# Patient Record
Sex: Female | Born: 1935 | Race: White | Hispanic: No | Smoking: Never smoker
Health system: Southern US, Community
[De-identification: ages and names within clinical notes are randomized; demographics above are authoritative.]

## PROBLEM LIST (undated history)

## (undated) DIAGNOSIS — I10 Essential (primary) hypertension: Secondary | ICD-10-CM

## (undated) DIAGNOSIS — I499 Cardiac arrhythmia, unspecified: Secondary | ICD-10-CM

## (undated) DIAGNOSIS — E039 Hypothyroidism, unspecified: Secondary | ICD-10-CM

---

## 2002-01-03 ENCOUNTER — Other Ambulatory Visit: Admission: RE | Admit: 2002-01-03 | Discharge: 2002-01-03 | Payer: Self-pay | Admitting: Dermatology

## 2002-03-17 ENCOUNTER — Other Ambulatory Visit: Admission: RE | Admit: 2002-03-17 | Discharge: 2002-03-17 | Payer: Self-pay | Admitting: Dermatology

## 2003-05-29 ENCOUNTER — Encounter: Payer: Self-pay | Admitting: *Deleted

## 2003-05-29 ENCOUNTER — Emergency Department (HOSPITAL_COMMUNITY): Admission: EM | Admit: 2003-05-29 | Discharge: 2003-05-29 | Payer: Self-pay | Admitting: *Deleted

## 2012-05-21 ENCOUNTER — Emergency Department (HOSPITAL_COMMUNITY): Payer: Medicare Other

## 2012-05-21 ENCOUNTER — Emergency Department (HOSPITAL_COMMUNITY)
Admission: EM | Admit: 2012-05-21 | Discharge: 2012-05-21 | Disposition: A | Discharge status: Home / Self Care | Payer: Medicare Other | Attending: Emergency Medicine | Admitting: Emergency Medicine

## 2012-05-21 ENCOUNTER — Encounter (HOSPITAL_COMMUNITY): Payer: Self-pay

## 2012-05-21 DIAGNOSIS — W010XXA Fall on same level from slipping, tripping and stumbling without subsequent striking against object, initial encounter: Secondary | ICD-10-CM | POA: Insufficient documentation

## 2012-05-21 DIAGNOSIS — IMO0002 Reserved for concepts with insufficient information to code with codable children: Secondary | ICD-10-CM | POA: Insufficient documentation

## 2012-05-21 DIAGNOSIS — M5030 Other cervical disc degeneration, unspecified cervical region: Secondary | ICD-10-CM | POA: Insufficient documentation

## 2012-05-21 DIAGNOSIS — S0990XA Unspecified injury of head, initial encounter: Secondary | ICD-10-CM | POA: Insufficient documentation

## 2012-05-21 DIAGNOSIS — W19XXXA Unspecified fall, initial encounter: Secondary | ICD-10-CM

## 2012-05-21 DIAGNOSIS — S7000XA Contusion of unspecified hip, initial encounter: Secondary | ICD-10-CM

## 2012-05-21 DIAGNOSIS — I10 Essential (primary) hypertension: Secondary | ICD-10-CM | POA: Insufficient documentation

## 2012-05-21 HISTORY — DX: Essential (primary) hypertension: I10

## 2012-05-21 HISTORY — DX: Cardiac arrhythmia, unspecified: I49.9

## 2012-05-21 NOTE — ED Notes (Signed)
Pt reports slipping on wet bathroom floor while at home this evening, pt states her family member mopped earlier this evening. Pt denies LOC - pt noted to have bruising to left face, small abrasion to left clavicle, multiple small bruises to left arm w/ small abrasion to left elbow and contusion to left knee. Pt's only c/o at present is to left elbow w/ movement. Pt A&Ox4 in no acute distress.

## 2012-05-21 NOTE — ED Notes (Signed)
Pt fell in the bathroom, states she hit her left arm on the sink, denies dizzyness or other complaint prior to falling.  Pt is awake, alert, denies neck or head pain.  Per ems pt did fall in some liquid chlorox but has not complaints d/t same.

## 2012-05-21 NOTE — ED Notes (Signed)
D/c instructions reviewed w/ pt and family - pt and family deny any further questions or concerns at present.\ 

## 2012-05-21 NOTE — ED Provider Notes (Signed)
History   This chart was scribed for Benny Lennert, MD by Melba Coon. The patient was seen in room APA06/APA06 and the patient's care was started at 8:42PM.    CSN: [id number]  Arrival date & time 05/21/12  2022   First MD Initiated Contact with Patient 05/21/12 2029      Chief Complaint  Patient presents with  . Fall    (Consider location/radiation/quality/duration/timing/severity/associated sxs/prior treatment) Patient is a 78 y.o. female presenting with fall. The history is provided by the patient and a relative (family). No language interpreter was used.  Fall The accident occurred less than 1 hour ago. The fall occurred while walking (slipped in liquid Chlorox). She fell from a height of 1 to 2 ft. She landed on a hard floor. The volume of blood lost was minimal. The point of impact was the left elbow and left hip. Pain location: none. The pain is at a severity of 1/10. The pain is mild. She was ambulatory at the scene. There was no entrapment after the fall. There was no drug use involved in the accident. There was no alcohol use involved in the accident. Pertinent negatives include no fever, no numbness, no abdominal pain, no vomiting, no headaches and no loss of consciousness. The symptoms are aggravated by activity. Prehospitalization: nothign. She has tried nothing for the symptoms. The treatment provided no relief.  Pt slipped and fell in a puddle of liquid Clorox in the bathroom, no head contact or LOC; pt states she hit her left arm on the sink and fell on her left hip. Pt usually uses her cane; could not get up by herself after fall, but pt still ambulatory compared to baseline. Pt reports no pain. EMS was called out to take pt to the ED. Pt is awake and alert. Family states that abrasions to the head are new; state hat pt does not want to be here. Pt thinks that XRs are unnecessary and that she should go home. Tetanus shot is not up-to-date. Allergic to Prednisone. No other  pertinent medical symptoms.  Past Medical History  Diagnosis Date  . Hypertension   . Irregular heartbeat     History reviewed. No pertinent past surgical history.  No family history on file.  History  Substance Use Topics  . Smoking status: Never Smoker   . Smokeless tobacco: Not on file  . Alcohol Use: No    OB History    Grav Para Term Preterm Abortions TAB SAB Ect Mult Living                  Review of Systems  Constitutional: Negative for fever.       10 Systems reviewed and are negative for acute change except as noted in the HPI.  HENT: Negative for congestion and neck pain.   Eyes: Negative for discharge and redness.  Respiratory: Negative for cough and shortness of breath.   Cardiovascular: Negative for chest pain.  Gastrointestinal: Negative for vomiting and abdominal pain.  Musculoskeletal: Negative for back pain.  Skin: Negative for rash.  Neurological: Negative for dizziness, loss of consciousness, syncope, numbness and headaches.  Psychiatric/Behavioral:       No behavior change.    Allergies  Prednisone  Home Medications  No current outpatient prescriptions on file.  There were no vitals taken for this visit.  Physical Exam  Nursing note and vitals reviewed. Constitutional: She is oriented to person, place, and time. She appears well-developed and well-nourished. She appears  cachectic. No distress.  HENT:  Head: Normocephalic and atraumatic.  Right Ear: External ear normal.  Left Ear: External ear normal.  Eyes: EOM are normal.  Neck: Normal range of motion. No tracheal deviation present.  Cardiovascular: Normal rate, regular rhythm and normal heart sounds.   No murmur heard. Pulmonary/Chest: Effort normal and breath sounds normal. No respiratory distress.  Abdominal: Soft. There is no tenderness.  Musculoskeletal: Normal range of motion. She exhibits no edema and no tenderness.  Neurological: She is alert and oriented to person, place, and  time.  Skin: Skin is warm and dry. Abrasion (Abrasions to left side of face and left forearm) noted. No rash noted.  Psychiatric: She has a normal mood and affect. Her behavior is normal.    ED Course  Procedures (including critical care time)  DIAGNOSTIC STUDIES: Oxygen Saturation is 97% on room air, normal by my interpretation.    COORDINATION OF CARE:  8:45PM - head CT, C-spine CT, and left hip XR will be ordered for the pt.   Labs Reviewed - No data to display No results found.   No diagnosis found.    MDM   The chart was scribed for me under my direct supervision.  I personally performed the history, physical, and medical decision making and all procedures in the evaluation of this patient.Benny Lennert, MD 05/21/12 2150

## 2015-08-14 ENCOUNTER — Other Ambulatory Visit: Payer: Self-pay

## 2015-08-14 ENCOUNTER — Emergency Department (HOSPITAL_COMMUNITY)
Admission: EM | Admit: 2015-08-14 | Discharge: 2015-08-14 | Disposition: A | Discharge status: Home / Self Care | Payer: Medicare Other | Source: Home / Self Care | Attending: Emergency Medicine | Admitting: Emergency Medicine

## 2015-08-14 ENCOUNTER — Encounter (HOSPITAL_COMMUNITY): Payer: Self-pay | Admitting: Emergency Medicine

## 2015-08-14 ENCOUNTER — Emergency Department (HOSPITAL_COMMUNITY): Payer: Medicare Other

## 2015-08-14 DIAGNOSIS — I63511 Cerebral infarction due to unspecified occlusion or stenosis of right middle cerebral artery: Secondary | ICD-10-CM | POA: Diagnosis not present

## 2015-08-14 DIAGNOSIS — R531 Weakness: Secondary | ICD-10-CM

## 2015-08-14 DIAGNOSIS — R4182 Altered mental status, unspecified: Secondary | ICD-10-CM | POA: Diagnosis not present

## 2015-08-14 LAB — BRAIN NATRIURETIC PEPTIDE: B Natriuretic Peptide: 986 pg/mL — ABNORMAL HIGH (ref 0.0–100.0)

## 2015-08-14 LAB — CBC WITH DIFFERENTIAL/PLATELET
BASOS ABS: 0 10*3/uL (ref 0.0–0.1)
Basophils Relative: 0 %
EOS ABS: 0 10*3/uL (ref 0.0–0.7)
EOS PCT: 0 %
HCT: 33 % — ABNORMAL LOW (ref 36.0–46.0)
Hemoglobin: 10.6 g/dL — ABNORMAL LOW (ref 12.0–15.0)
LYMPHS ABS: 2.4 10*3/uL (ref 0.7–4.0)
Lymphocytes Relative: 16 %
MCH: 32.6 pg (ref 26.0–34.0)
MCHC: 32.1 g/dL (ref 30.0–36.0)
MCV: 101.5 fL — AB (ref 78.0–100.0)
MONO ABS: 0.9 10*3/uL (ref 0.1–1.0)
Monocytes Relative: 6 %
Neutro Abs: 12.2 10*3/uL — ABNORMAL HIGH (ref 1.7–7.7)
Neutrophils Relative %: 78 %
PLATELETS: 268 10*3/uL (ref 150–400)
RBC: 3.25 MIL/uL — AB (ref 3.87–5.11)
RDW: 13.7 % (ref 11.5–15.5)
WBC: 15.5 10*3/uL — AB (ref 4.0–10.5)

## 2015-08-14 LAB — URINALYSIS, ROUTINE W REFLEX MICROSCOPIC
BILIRUBIN URINE: NEGATIVE
Glucose, UA: NEGATIVE mg/dL
HGB URINE DIPSTICK: NEGATIVE
KETONES UR: NEGATIVE mg/dL
Leukocytes, UA: NEGATIVE
NITRITE: NEGATIVE
PH: 7 (ref 5.0–8.0)
Protein, ur: 100 mg/dL — AB
SPECIFIC GRAVITY, URINE: 1.02 (ref 1.005–1.030)
Urobilinogen, UA: 0.2 mg/dL (ref 0.0–1.0)

## 2015-08-14 LAB — COMPREHENSIVE METABOLIC PANEL
ALK PHOS: 104 U/L (ref 38–126)
ALT: 19 U/L (ref 14–54)
ANION GAP: 10 (ref 5–15)
AST: 23 U/L (ref 15–41)
Albumin: 3.2 g/dL — ABNORMAL LOW (ref 3.5–5.0)
BILIRUBIN TOTAL: 1.1 mg/dL (ref 0.3–1.2)
BUN: 24 mg/dL — ABNORMAL HIGH (ref 6–20)
CALCIUM: 8.8 mg/dL — AB (ref 8.9–10.3)
CO2: 23 mmol/L (ref 22–32)
CREATININE: 1.05 mg/dL — AB (ref 0.44–1.00)
Chloride: 103 mmol/L (ref 101–111)
GFR, EST AFRICAN AMERICAN: 50 mL/min — AB (ref >60)
GFR, EST NON AFRICAN AMERICAN: 43 mL/min — AB (ref >60)
Glucose, Bld: 145 mg/dL — ABNORMAL HIGH (ref 65–99)
Potassium: 3.8 mmol/L (ref 3.5–5.1)
Sodium: 136 mmol/L (ref 135–145)
TOTAL PROTEIN: 6.6 g/dL (ref 6.5–8.1)

## 2015-08-14 LAB — URINE MICROSCOPIC-ADD ON

## 2015-08-14 LAB — I-STAT CG4 LACTIC ACID, ED: LACTIC ACID, VENOUS: 1.64 mmol/L (ref 0.5–2.0)

## 2015-08-14 LAB — TROPONIN I

## 2015-08-14 MED ORDER — SODIUM CHLORIDE 0.9 % IV SOLN
INTRAVENOUS | Status: DC
  Administered 2015-08-14: 17:00:00 via INTRAVENOUS

## 2015-08-14 NOTE — Discharge Instructions (Signed)
As discussed, with tonight's evaluation, demonstrating dehydration, elevated BNP, reflecting poor efficiency of the heart, it is important she follows up with her primary care physician.  In addition, home health services, social work, we'll contact you regarding possible hospice involvement.   Return here for concerning changes in her condition.   Weakness Weakness is a lack of strength. It may be felt all over the body (generalized) or in one specific part of the body (focal). Some causes of weakness can be serious. You may need further medical evaluation, especially if you are elderly or you have a history of immunosuppression (such as chemotherapy or HIV), kidney disease, heart disease, or diabetes. CAUSES  Weakness can be caused by many different things, including:  Infection.  Physical exhaustion.  Internal bleeding or other blood loss that results in a lack of red blood cells (anemia).  Dehydration. This cause is more common in elderly people.  Side effects or electrolyte abnormalities from medicines, such as pain medicines or sedatives.  Emotional distress, anxiety, or depression.  Circulation problems, especially severe peripheral arterial disease.  Heart disease, such as rapid atrial fibrillation, bradycardia, or heart failure.  Nervous system disorders, such as Guillain-Barr syndrome, multiple sclerosis, or stroke. DIAGNOSIS  To find the cause of your weakness, your caregiver will take your history and perform a physical exam. Lab tests or X-rays may also be ordered, if needed. TREATMENT  Treatment of weakness depends on the cause of your symptoms and can vary greatly. HOME CARE INSTRUCTIONS   Rest as needed.  Eat a well-balanced diet.  Try to get some exercise every day.  Only take over-the-counter or prescription medicines as directed by your caregiver. SEEK MEDICAL CARE IF:   Your weakness seems to be getting worse or spreads to other parts of your  body.  You develop new aches or pains. SEEK IMMEDIATE MEDICAL CARE IF:   You cannot perform your normal daily activities, such as getting dressed and feeding yourself.  You cannot walk up and down stairs, or you feel exhausted when you do so.  You have shortness of breath or chest pain.  You have difficulty moving parts of your body.  You have weakness in only one area of the body or on only one side of the body.  You have a fever.  You have trouble speaking or swallowing.  You cannot control your bladder or bowel movements.  You have black or bloody vomit or stools. MAKE SURE YOU:  Understand these instructions.  Will watch your condition.  Will get help right away if you are not doing well or get worse.   This information is not intended to replace advice given to you by your health care provider. Make sure you discuss any questions you have with your health care provider.   Document Released: 10/06/2005 Document Revised: 04/06/2012 Document Reviewed: 12/05/2011 Elsevier Interactive Patient Education [employer]2016 [employer]

## 2015-08-14 NOTE — ED Notes (Signed)
Per EMS; called to patient's house by family due to generalized weakness and abdominal pain. Family states history of dementia. Patient follows commands [facility] 14. Patient's family stats patient has been complaining of head aches for past week.

## 2015-08-14 NOTE — ED Provider Notes (Signed)
CSN: [id number]     Arrival date & time 08/14/15  1514 History   First MD Initiated Contact with Patient 08/14/15 1521     Chief Complaint  Patient presents with  . Weakness    HPI  Patient presents with multiple family members who provide history of present illness. Patient has dementia. Level V caveat. The family members note that over the past day the patient has become unwilling to walk, left participatory with less oral intake. No report of new fever, chills, vomiting, diarrhea, fall. No new medication, diet, activity.   Past Medical History  Diagnosis Date  . Hypertension   . Irregular heartbeat    History reviewed. No pertinent past surgical history. No family history on file. Social History  Substance Use Topics  . Smoking status: Never Smoker   . Smokeless tobacco: None  . Alcohol Use: No   OB History    No data available     Review of Systems  Unable to perform ROS: Dementia      Allergies  Prednisone  Home Medications   Prior to Admission medications   Medication Sig Start Date End Date Taking? Authorizing Provider  aspirin EC 81 MG tablet Take 81 mg by mouth every morning.   Yes Historical Provider, MD  BYSTOLIC 5 MG tablet Take 5 mg by mouth daily. 07/17/15  Yes Historical Provider, MD  cloNIDine (CATAPRES) 0.1 MG tablet Take 0.1 mg by mouth at bedtime.  07/17/15  Yes Historical Provider, MD  Iron-Vitamins (GERITOL COMPLETE PO) Take 1 tablet by mouth daily.   Yes Historical Provider, MD  levothyroxine (SYNTHROID, LEVOTHROID) 50 MCG tablet Take 50 mcg by mouth daily. 08/05/15  Yes Historical Provider, MD  LORazepam (ATIVAN) 0.5 MG tablet Take 0.25 mg by mouth at bedtime.    Yes Historical Provider, MD  methyldopa (ALDOMET) 250 MG tablet Take 250 mg by mouth 2 (two) times daily. 07/26/15  Yes Historical Provider, MD  Naphazoline HCl (CLEAR EYES OP) Apply 1 drop to eye daily as needed (for dry eye relief).    Yes Historical Provider, MD  polyethylene  glycol powder (GLYCOLAX/MIRALAX) powder Take 17 g by mouth at bedtime.   Yes Historical Provider, MD  senna-docusate (SENNA PLUS) 8.6-50 MG per tablet Take 1 tablet by mouth at bedtime.    Yes Historical Provider, MD  telmisartan (MICARDIS) 40 MG tablet Take 40 mg by mouth every morning.   Yes Historical Provider, MD  verapamil (CALAN-SR) 240 MG CR tablet Take 240 mg by mouth every morning.   Yes Historical Provider, MD   BP 142/44 mmHg  Pulse 62  Temp(Src) 97.4 F (36.3 C) (Oral)  Resp 16  Ht  (1.575 m)  Wt 97 lb (43.999 kg)  BMI 17.74 kg/m2  SpO2 96% Physical Exam  Constitutional: She is oriented to person, place, and time. She appears cachectic. She has a sickly appearance. No distress.  HENT:  Head: Normocephalic and atraumatic.  Eyes: Conjunctivae and EOM are normal.  Cardiovascular: Normal rate and regular rhythm.   Pulmonary/Chest: Effort normal and breath sounds normal. No stridor. No respiratory distress.  Abdominal: She exhibits no distension.  Musculoskeletal: She exhibits no edema.  Neurological: She is alert and oriented to person, place, and time. She displays atrophy. She displays no tremor. No cranial nerve deficit or sensory deficit. She exhibits normal muscle tone. She displays no seizure activity. Coordination normal.  Speech is brief, clear the patient moves all extremity spontaneously.  Skin: Skin is warm  and dry. There is pallor.  Psychiatric: She is slowed and withdrawn. Cognition and memory are impaired.  Nursing note and vitals reviewed.   ED Course  Procedures (including critical care time) Labs Review Labs Reviewed  COMPREHENSIVE METABOLIC PANEL - Abnormal; Notable for the following:    Glucose, Bld 145 (*)    BUN 24 (*)    Creatinine, Ser 1.05 (*)    Calcium 8.8 (*)    Albumin 3.2 (*)    GFR calc non Af Amer 43 (*)    GFR calc Af Amer 50 (*)    All other components within normal limits  BRAIN NATRIURETIC PEPTIDE - Abnormal; Notable for the  following:    B Natriuretic Peptide 986.0 (*)    All other components within normal limits  CBC WITH DIFFERENTIAL/PLATELET - Abnormal; Notable for the following:    WBC 15.5 (*)    RBC 3.25 (*)    Hemoglobin 10.6 (*)    HCT 33.0 (*)    MCV 101.5 (*)    Neutro Abs 12.2 (*)    All other components within normal limits  URINALYSIS, ROUTINE W REFLEX MICROSCOPIC (NOT AT [facility]) - Abnormal; Notable for the following:    Protein, ur 100 (*)    All other components within normal limits  URINE MICROSCOPIC-ADD ON - Abnormal; Notable for the following:    Squamous Epithelial / LPF FEW (*)    Bacteria, UA FEW (*)    Casts HYALINE CASTS (*)    All other components within normal limits  TROPONIN I  I-STAT CG4 LACTIC ACID, ED    Imaging Review Dg Chest Port 1 View  08/14/2015  CLINICAL DATA:  Generalized weakness and abdominal pain, history of dementia. Headaches for past week. EXAM: PORTABLE CHEST 1 VIEW COMPARISON:  None. FINDINGS: There is mild cardiomegaly. Atherosclerotic changes noted along the walls of the grossly normal- caliber aortic arch. Pulmonary vasculature is within normal limits. Lungs appear hyperexpanded suggesting COPD. Suspect mild chronic scarring/fibrosis within each lung, upper lobe predominant. No confluent airspace opacity to suggest a developing pneumonia. No pleural effusions seen. No pneumothorax. Osseous structures are unremarkable. IMPRESSION: 1. Hyperexpanded lungs suggesting COPD. 2. Mild cardiomegaly. 3. No evidence of acute cardiopulmonary abnormality. Electronically Signed   By: Bary RichardStan  Maynard M.D.   On: 08/14/2015 16:42   I have personally reviewed and evaluated these images and lab results as part of my medical decision-making.   EKG with sinus rhythm, rate 61, artifact, intraventricular conduction delay, abnormal  On repeat exam, IV fluids running, the patient appears slightly better, is now opening her eyes.   9:36 PM Patient now awake, interactive.  There  are additional family members present, and with them we discussed all findings, including elevated BNP, mild dehydration. Given the patient's age, comorbidities, findings, advocated for home health evaluation, consideration of hospice enrollment at some point. Family members were agreeable with these suggestions. MDM   Final diagnoses:  Weakness   This elderly female presents with family members  who assist with the history of present illness given the patient's history of dementia. Here the patient is listless, but hemodynamically stable. Labs notable for mildly elevated BNP, leukocytosis, evidence of dehydration. With fluid resuscitation, the patient awakens, is interacting at baseline. No evidence for decompensated heart failure, nor infection, with reassuring urine, x-ray, no cutaneous findings. I had lengthy conversations with multiple family members about the patient's health, prognosis, need for home assistance, particularly given her diminished ambulatory capacity.  Patient discharged in  stable condition with home health evaluation, consideration of hospice enrollment.    Gerhard Munch, MD 08/14/15 2138

## 2015-08-16 ENCOUNTER — Emergency Department (HOSPITAL_COMMUNITY): Payer: Medicare Other

## 2015-08-16 ENCOUNTER — Encounter (HOSPITAL_COMMUNITY): Payer: Self-pay

## 2015-08-16 ENCOUNTER — Inpatient Hospital Stay (HOSPITAL_COMMUNITY)
Admission: EM | Admit: 2015-08-16 | Discharge: 2015-08-21 | DRG: 064 | Disposition: A | Discharge status: Hospice | Payer: Medicare Other | Attending: Family Medicine | Admitting: Family Medicine

## 2015-08-16 DIAGNOSIS — Z515 Encounter for palliative care: Secondary | ICD-10-CM | POA: Diagnosis present

## 2015-08-16 DIAGNOSIS — I6330 Cerebral infarction due to thrombosis of unspecified cerebral artery: Secondary | ICD-10-CM | POA: Insufficient documentation

## 2015-08-16 DIAGNOSIS — E785 Hyperlipidemia, unspecified: Secondary | ICD-10-CM | POA: Diagnosis present

## 2015-08-16 DIAGNOSIS — I639 Cerebral infarction, unspecified: Secondary | ICD-10-CM | POA: Diagnosis present

## 2015-08-16 DIAGNOSIS — J449 Chronic obstructive pulmonary disease, unspecified: Secondary | ICD-10-CM | POA: Diagnosis present

## 2015-08-16 DIAGNOSIS — M810 Age-related osteoporosis without current pathological fracture: Secondary | ICD-10-CM | POA: Diagnosis present

## 2015-08-16 DIAGNOSIS — F0390 Unspecified dementia without behavioral disturbance: Secondary | ICD-10-CM | POA: Diagnosis present

## 2015-08-16 DIAGNOSIS — J438 Other emphysema: Secondary | ICD-10-CM

## 2015-08-16 DIAGNOSIS — Z681 Body mass index (BMI) 19 or less, adult: Secondary | ICD-10-CM

## 2015-08-16 DIAGNOSIS — E43 Unspecified severe protein-calorie malnutrition: Secondary | ICD-10-CM | POA: Diagnosis present

## 2015-08-16 DIAGNOSIS — I69354 Hemiplegia and hemiparesis following cerebral infarction affecting left non-dominant side: Secondary | ICD-10-CM

## 2015-08-16 DIAGNOSIS — N289 Disorder of kidney and ureter, unspecified: Secondary | ICD-10-CM

## 2015-08-16 DIAGNOSIS — I6300 Cerebral infarction due to thrombosis of unspecified precerebral artery: Secondary | ICD-10-CM

## 2015-08-16 DIAGNOSIS — I63511 Cerebral infarction due to unspecified occlusion or stenosis of right middle cerebral artery: Principal | ICD-10-CM | POA: Diagnosis present

## 2015-08-16 DIAGNOSIS — R531 Weakness: Secondary | ICD-10-CM | POA: Diagnosis present

## 2015-08-16 DIAGNOSIS — R410 Disorientation, unspecified: Secondary | ICD-10-CM | POA: Diagnosis not present

## 2015-08-16 DIAGNOSIS — R4182 Altered mental status, unspecified: Secondary | ICD-10-CM | POA: Diagnosis present

## 2015-08-16 DIAGNOSIS — J9611 Chronic respiratory failure with hypoxia: Secondary | ICD-10-CM | POA: Diagnosis present

## 2015-08-16 DIAGNOSIS — Z66 Do not resuscitate: Secondary | ICD-10-CM | POA: Diagnosis present

## 2015-08-16 DIAGNOSIS — M1990 Unspecified osteoarthritis, unspecified site: Secondary | ICD-10-CM | POA: Diagnosis present

## 2015-08-16 DIAGNOSIS — R55 Syncope and collapse: Secondary | ICD-10-CM | POA: Diagnosis present

## 2015-08-16 DIAGNOSIS — E039 Hypothyroidism, unspecified: Secondary | ICD-10-CM | POA: Diagnosis present

## 2015-08-16 DIAGNOSIS — I638 Other cerebral infarction: Secondary | ICD-10-CM | POA: Diagnosis not present

## 2015-08-16 DIAGNOSIS — I10 Essential (primary) hypertension: Secondary | ICD-10-CM | POA: Diagnosis present

## 2015-08-16 DIAGNOSIS — Z7982 Long term (current) use of aspirin: Secondary | ICD-10-CM

## 2015-08-16 DIAGNOSIS — G8194 Hemiplegia, unspecified affecting left nondominant side: Secondary | ICD-10-CM | POA: Diagnosis present

## 2015-08-16 DIAGNOSIS — E860 Dehydration: Secondary | ICD-10-CM | POA: Diagnosis present

## 2015-08-16 DIAGNOSIS — R400 Somnolence: Secondary | ICD-10-CM

## 2015-08-16 DIAGNOSIS — I6389 Other cerebral infarction: Secondary | ICD-10-CM

## 2015-08-16 DIAGNOSIS — D649 Anemia, unspecified: Secondary | ICD-10-CM | POA: Diagnosis present

## 2015-08-16 DIAGNOSIS — I6789 Other cerebrovascular disease: Secondary | ICD-10-CM | POA: Diagnosis not present

## 2015-08-16 HISTORY — DX: Hypothyroidism, unspecified: E03.9

## 2015-08-16 LAB — CBC
HEMATOCRIT: 30.6 % — AB (ref 36.0–46.0)
HEMOGLOBIN: 9.5 g/dL — AB (ref 12.0–15.0)
MCH: 32.6 pg (ref 26.0–34.0)
MCHC: 31 g/dL (ref 30.0–36.0)
MCV: 105.2 fL — AB (ref 78.0–100.0)
Platelets: 221 10*3/uL (ref 150–400)
RBC: 2.91 MIL/uL — ABNORMAL LOW (ref 3.87–5.11)
RDW: 14.1 % (ref 11.5–15.5)
WBC: 10.3 10*3/uL (ref 4.0–10.5)

## 2015-08-16 LAB — URINE MICROSCOPIC-ADD ON

## 2015-08-16 LAB — COMPREHENSIVE METABOLIC PANEL
ALK PHOS: 107 U/L (ref 38–126)
ALT: 17 U/L (ref 14–54)
AST: 29 U/L (ref 15–41)
Albumin: 2.7 g/dL — ABNORMAL LOW (ref 3.5–5.0)
Anion gap: 5 (ref 5–15)
BILIRUBIN TOTAL: 0.4 mg/dL (ref 0.3–1.2)
BUN: 34 mg/dL — AB (ref 6–20)
CALCIUM: 8.5 mg/dL — AB (ref 8.9–10.3)
CO2: 24 mmol/L (ref 22–32)
CREATININE: 1.4 mg/dL — AB (ref 0.44–1.00)
Chloride: 110 mmol/L (ref 101–111)
GFR, EST AFRICAN AMERICAN: 35 mL/min — AB (ref >60)
GFR, EST NON AFRICAN AMERICAN: 30 mL/min — AB (ref >60)
Glucose, Bld: 141 mg/dL — ABNORMAL HIGH (ref 65–99)
Potassium: 4.2 mmol/L (ref 3.5–5.1)
Sodium: 139 mmol/L (ref 135–145)
TOTAL PROTEIN: 6.1 g/dL — AB (ref 6.5–8.1)

## 2015-08-16 LAB — DIFFERENTIAL
BASOS ABS: 0 10*3/uL (ref 0.0–0.1)
BASOS PCT: 0 %
Eosinophils Absolute: 0.1 10*3/uL (ref 0.0–0.7)
Eosinophils Relative: 1 %
LYMPHS ABS: 1.6 10*3/uL (ref 0.7–4.0)
Lymphocytes Relative: 15 %
MONO ABS: 0.8 10*3/uL (ref 0.1–1.0)
MONOS PCT: 8 %
NEUTROS ABS: 7.8 10*3/uL — AB (ref 1.7–7.7)
Neutrophils Relative %: 76 %

## 2015-08-16 LAB — URINALYSIS, ROUTINE W REFLEX MICROSCOPIC
BILIRUBIN URINE: NEGATIVE
Glucose, UA: NEGATIVE mg/dL
Hgb urine dipstick: NEGATIVE
Ketones, ur: NEGATIVE mg/dL
LEUKOCYTES UA: NEGATIVE
NITRITE: NEGATIVE
PH: 6 (ref 5.0–8.0)
Protein, ur: 30 mg/dL — AB
SPECIFIC GRAVITY, URINE: 1.025 (ref 1.005–1.030)
UROBILINOGEN UA: 0.2 mg/dL (ref 0.0–1.0)

## 2015-08-16 LAB — PROTIME-INR
INR: 1.1 (ref 0.00–1.49)
Prothrombin Time: 14.4 seconds (ref 11.6–15.2)

## 2015-08-16 LAB — RAPID URINE DRUG SCREEN, HOSP PERFORMED
Amphetamines: POSITIVE — AB
Barbiturates: NOT DETECTED
Benzodiazepines: POSITIVE — AB
COCAINE: NOT DETECTED
OPIATES: NOT DETECTED
TETRAHYDROCANNABINOL: NOT DETECTED

## 2015-08-16 LAB — LACTIC ACID, PLASMA: Lactic Acid, Venous: 2.6 mmol/L (ref 0.5–2.0)

## 2015-08-16 LAB — CBG MONITORING, ED: GLUCOSE-CAPILLARY: 126 mg/dL — AB (ref 65–99)

## 2015-08-16 LAB — TROPONIN I

## 2015-08-16 LAB — ETHANOL

## 2015-08-16 LAB — APTT: aPTT: 30 seconds (ref 24–37)

## 2015-08-16 LAB — POC OCCULT BLOOD, ED: FECAL OCCULT BLD: POSITIVE — AB

## 2015-08-16 MED ORDER — ASPIRIN 325 MG PO TABS
325.0000 mg | ORAL_TABLET | Freq: Every day | ORAL | Status: DC
  Administered 2015-08-18: 325 mg via ORAL
  Filled 2015-08-16 (×4): qty 1

## 2015-08-16 MED ORDER — STROKE: EARLY STAGES OF RECOVERY BOOK
Freq: Once | Status: AC
  Administered 2015-08-17: 12:00:00
  Filled 2015-08-16: qty 1

## 2015-08-16 MED ORDER — ASPIRIN 300 MG RE SUPP
300.0000 mg | Freq: Every day | RECTAL | Status: DC
  Filled 2015-08-16 (×6): qty 1

## 2015-08-16 NOTE — ED Notes (Signed)
Per Dr. Lynelle DoctorKnapp - Call Code Stroke. Secretary made aware.

## 2015-08-16 NOTE — ED Notes (Signed)
Critical Result called from lab via [facility] Sickle[facility] Lactic Acid 2.6 Read back and verified by Clinical research associatewriter. Dr. Lynelle DoctorKnapp made aware.

## 2015-08-16 NOTE — ED Notes (Signed)
Dr. Lynelle DoctorKnapp made aware of neuro status. No new orders given.

## 2015-08-16 NOTE — ED Notes (Signed)
Caregiver reports patient was sitting on commode attempting to have BM and "passed out." Caregiver reports patient did not fall, caregiver was able to catch patient. Patient appears drowsy. Able to state name/DOB. Denies pain.

## 2015-08-16 NOTE — ED Notes (Signed)
X-ray at bedside

## 2015-08-16 NOTE — ED Notes (Signed)
MD at bedside. 
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
## 2015-08-16 NOTE — ED Notes (Signed)
Returned from Ct at this time with nurse.

## 2015-08-16 NOTE — ED Notes (Signed)
Dr Knapp at bedside,  

## 2015-08-16 NOTE — ED Notes (Signed)
Per Dr. Lynelle DoctorKnapp- Cancel code stroke.

## 2015-08-16 NOTE — ED Notes (Signed)
Pt family requesting to speak with EDP,

## 2015-08-16 NOTE — ED Provider Notes (Signed)
CSN: [id number]   Arrival date & time 08/16/15 2013  History  By signing my name below, I, Brooke Simpson, attest that this documentation has been prepared under the direction and in the presence of Linwood DibblesJon Jonte Shiller, MD. Electronically Signed: [name], ED Scribe. 08/16/2015. 9:21 PM.  Chief Complaint  Patient presents with  . Code Stroke    HPI The history is provided by a caregiver. The history is limited by the condition of the patient. No language interpreter was used.   Level V caveat.   Brooke Simpson is a 81 y.o. female with history of dementia, HTN, and hypothyroidism who presents to the Emergency Department complaining of a syncopal episode 1 hour ago while attempting to have a bowel movement. Per caregiver, the pt went limp, was not talking, and her breathing was "way off". She did not fall and her caregiver was able to assist her from the toilet.  Associated symptoms include left-sided weakness. Pt was treated for dehydration 2 days ago where she had more generalized weakness and was not able to walk but family states that she was able to walk at home over the last 2 days. They state that she seems altered as usually she is able to speak clearly and walk.  No recent fever.   Past Medical History  Diagnosis Date  . Hypertension   . Irregular heartbeat   . Hypothyroid     History reviewed. No pertinent past surgical history.  No family history on file.  Social History  Substance Use Topics  . Smoking status: Never Smoker   . Smokeless tobacco: None  . Alcohol Use: No     Review of Systems  Unable to perform ROS: Dementia    Home Medications   Prior to Admission medications   Medication Sig Start Date End Date Taking? Authorizing Provider  aspirin EC 81 MG tablet Take 81 mg by mouth every morning.    Historical Provider, MD  BYSTOLIC 5 MG tablet Take 5 mg by mouth daily. 07/17/15   Historical Provider, MD  cloNIDine (CATAPRES) 0.1 MG tablet Take 0.1 mg by mouth at  bedtime.  07/17/15   Historical Provider, MD  Iron-Vitamins (GERITOL COMPLETE PO) Take 1 tablet by mouth daily.    Historical Provider, MD  levothyroxine (SYNTHROID, LEVOTHROID) 50 MCG tablet Take 50 mcg by mouth daily. 08/05/15   Historical Provider, MD  LORazepam (ATIVAN) 0.5 MG tablet Take 0.25 mg by mouth at bedtime.     Historical Provider, MD  methyldopa (ALDOMET) 250 MG tablet Take 250 mg by mouth 2 (two) times daily. 07/26/15   Historical Provider, MD  Naphazoline HCl (CLEAR EYES OP) Apply 1 drop to eye daily as needed (for dry eye relief).     Historical Provider, MD  polyethylene glycol powder (GLYCOLAX/MIRALAX) powder Take 17 g by mouth at bedtime.    Historical Provider, MD  senna-docusate (SENNA PLUS) 8.6-50 MG per tablet Take 1 tablet by mouth at bedtime.     Historical Provider, MD  telmisartan (MICARDIS) 40 MG tablet Take 40 mg by mouth every morning.    Historical Provider, MD  verapamil (CALAN-SR) 240 MG CR tablet Take 240 mg by mouth every morning.    Historical Provider, MD    Allergies  Prednisone  Triage Vitals: BP 163/54 mmHg  Pulse 57  Temp(Src) 97.5 F (36.4 C) (Oral)  Resp 14  Ht 5\' 2"  (1.575 m)  Wt 96 lb 2 oz (43.602 kg)  BMI 17.58 kg/m2  SpO2  97%  Physical Exam  Constitutional: She is oriented to person, place, and time. She appears listless. No distress.  Elderly, cachetic, listless  HENT:  Head: Normocephalic and atraumatic.  Right Ear: External ear normal.  Left Ear: External ear normal.  Mouth/Throat: Oropharynx is clear and moist.  Eyes: Conjunctivae are normal. Right eye exhibits no discharge. Left eye exhibits no discharge. No scleral icterus.  Neck: Neck supple. No tracheal deviation present.  Cardiovascular: Normal rate, regular rhythm and intact distal pulses.   Pulmonary/Chest: Effort normal and breath sounds normal. No stridor. No respiratory distress. She has no wheezes. She has no rales.  Abdominal: Soft. Bowel sounds are normal. She  exhibits no distension. There is no tenderness. There is no rebound and no guarding.  Musculoskeletal: She exhibits no edema or tenderness.  Neurological: She is oriented to person, place, and time. She appears listless. A sensory deficit (Diminished sensation left side) is present. Cranial nerve deficit: No obvious  facial droop, difficult to do full exam. She exhibits normal muscle tone. She displays no seizure activity. GCS eye subscore is 3. GCS verbal subscore is 4. GCS motor subscore is 6.  Unable to lift left arm, able to lift right arm with some assistance, able to move toes on right foot, no movement left foot  Skin: Skin is warm and dry. No rash noted. There is pallor.  Psychiatric: She has a normal mood and affect.  Nursing note and vitals reviewed.   ED Course  Procedures   DIAGNOSTIC STUDIES: Oxygen Saturation is 97% on 2 L, adequate by my interpretation.    COORDINATION OF CARE: 8:41 PM Discussed treatment plan which includes head CT with family at bedside and they agreed to plan.  Labs Reviewed  CBC - Abnormal; Notable for the following:    RBC 2.91 (*)    Hemoglobin 9.5 (*)    HCT 30.6 (*)    MCV 105.2 (*)    All other components within normal limits  DIFFERENTIAL - Abnormal; Notable for the following:    Neutro Abs 7.8 (*)    All other components within normal limits  COMPREHENSIVE METABOLIC PANEL - Abnormal; Notable for the following:    Glucose, Bld 141 (*)    BUN 34 (*)    Creatinine, Ser 1.40 (*)    Calcium 8.5 (*)    Total Protein 6.1 (*)    Albumin 2.7 (*)    GFR calc non Af Amer 30 (*)    GFR calc Af Amer 35 (*)    All other components within normal limits  CBG MONITORING, ED - Abnormal; Notable for the following:    Glucose-Capillary 126 (*)    All other components within normal limits  POC OCCULT BLOOD, ED - Abnormal; Notable for the following:    Fecal Occult Bld POSITIVE (*)    All other components within normal limits  ETHANOL  PROTIME-INR   APTT  URINE RAPID DRUG SCREEN, HOSP PERFORMED  URINALYSIS, ROUTINE W REFLEX MICROSCOPIC (NOT AT [facility])  TROPONIN I  LACTIC ACID, PLASMA  LACTIC ACID, PLASMA    Imaging Review Ct Head Wo Contrast  08/16/2015  CLINICAL DATA:  Altered level of consciousness and sudden onset of left-sided weakness. EXAM: CT HEAD WITHOUT CONTRAST TECHNIQUE: Contiguous axial images were obtained from the base of the skull through the vertex without contrast. COMPARISON:  05/21/2012 FINDINGS: Age appropriate atrophy with mild dilatation of the lateral ventricles. Again noted is diffuse low density throughout the periventricular white matter  which is unchanged. No evidence for acute hemorrhage, mass lesion, midline shift, hydrocephalus or large infarct. Patient has extensive paranasal sinus disease. Near complete opacification of the sphenoid sinuses. Complete opacification in the left maxillary sinus. Extensive opacification in the bilateral ethmoid air cells, left side greater than right. Complete opacification of the left frontal sinus. Mastoid air cells are clear. No acute bone abnormality. IMPRESSION: No acute intracranial abnormality. Stable atrophy with white matter changes. White matter changes may represent chronic small vessel ischemic disease. Extensive paranasal sinus disease as described. These results were called by telephone at the time of interpretation on 08/16/2015 at 9:07 pm to Dr. Linwood Dibbles , who verbally acknowledged these results. Electronically Signed   By: Richarda Overlie M.D.   On: 08/16/2015 21:07    I personally reviewed and evaluated these images and lab results as a part of my medical decision-making.   EKG Interpretation  Date/Time:  Thursday August 16 2015 20:22:54 EDT Ventricular Rate:  57 PR Interval:  58 QRS Duration: 130 QT Interval:  471 QTC Calculation: 459 R Axis:   -89 Text Interpretation:  Sinus rhythm Short PR interval Right bundle branch block No significant change since last  tracing Confirmed by Seerat Peaden  MD-J, Nariya Neumeyer ([id number]) on 08/16/2015 8:29:17 PM    MDM   Final diagnoses:  Cerebrovascular accident (CVA) due to other mechanism (HCC)  Renal insufficiency  Anemia, unspecified anemia type   Pt's symptoms are concerning for acute stroke.  She appears rather debilitated and I am concerned about her age and the possibility of other medical issues.  Code stroke activated while we gather more information.  Family states her sx started just one hour prior to arrival.  Worsening renal insufficiency. Hgb is 1 point lower than 2 days ago.  Guiac stool is positive.  Family states she is moving her left side slightly now.  Neurologist and I discussed all this with the family.  With her improving sx and guiac positive stools, recommend against TPA.  Will continue to evaluate.  Admit for monitoring further evaluation.    I personally performed the services described in this documentation, which was scribed in my presence.  The recorded information has been reviewed and is accurate.    Linwood Dibbles, MD 08/16/15 2149

## 2015-08-16 NOTE — ED Notes (Signed)
To Ct at this time with nurse.

## 2015-08-16 NOTE — Progress Notes (Signed)
2045[facility] Code Stroke to xray 2048 beeper went off announcing code stroke to room 6. 2050: patient brought to CT by RN 2056: Finished with patient CT scan/ Lab in CT room drawing labs 2057: Called [facility] to let them know there is a code stroke./ RN took patient back to  ER 2107: Results called to ordering MD by Radiologist/ exam completed in PACS and EPIC.

## 2015-08-16 NOTE — ED Notes (Signed)
Neuro consult in progress.

## 2015-08-17 ENCOUNTER — Inpatient Hospital Stay (HOSPITAL_COMMUNITY): Payer: Medicare Other

## 2015-08-17 DIAGNOSIS — I6330 Cerebral infarction due to thrombosis of unspecified cerebral artery: Secondary | ICD-10-CM | POA: Insufficient documentation

## 2015-08-17 LAB — CBC
HEMATOCRIT: 31.3 % — AB (ref 36.0–46.0)
Hemoglobin: 10 g/dL — ABNORMAL LOW (ref 12.0–15.0)
MCH: 32.4 pg (ref 26.0–34.0)
MCHC: 31.9 g/dL (ref 30.0–36.0)
MCV: 101.3 fL — AB (ref 78.0–100.0)
PLATELETS: 256 10*3/uL (ref 150–400)
RBC: 3.09 MIL/uL — AB (ref 3.87–5.11)
RDW: 13.7 % (ref 11.5–15.5)
WBC: 11.7 10*3/uL — AB (ref 4.0–10.5)

## 2015-08-17 LAB — VITAMIN B12: Vitamin B-12: 1263 pg/mL — ABNORMAL HIGH (ref 180–914)

## 2015-08-17 LAB — TSH: TSH: 2.351 u[IU]/mL (ref 0.350–4.500)

## 2015-08-17 LAB — SEDIMENTATION RATE: SED RATE: 100 mm/h — AB (ref 0–22)

## 2015-08-17 LAB — FOLATE: FOLATE: 46.6 ng/mL (ref >5.9)

## 2015-08-17 MED ORDER — HYDRALAZINE HCL 20 MG/ML IJ SOLN
5.0000 mg | Freq: Once | INTRAMUSCULAR | Status: AC
  Administered 2015-08-17: 5 mg via INTRAVENOUS
  Filled 2015-08-17: qty 1

## 2015-08-17 MED ORDER — ZOLPIDEM TARTRATE 5 MG PO TABS
5.0000 mg | ORAL_TABLET | Freq: Once | ORAL | Status: AC
  Administered 2015-08-17: 5 mg via ORAL
  Filled 2015-08-17: qty 1

## 2015-08-17 MED ORDER — LORAZEPAM 2 MG/ML IJ SOLN
0.5000 mg | Freq: Once | INTRAMUSCULAR | Status: AC
  Administered 2015-08-17: 0.5 mg via INTRAVENOUS
  Filled 2015-08-17: qty 1

## 2015-08-17 MED ORDER — SODIUM CHLORIDE 0.9 % IV SOLN
INTRAVENOUS | Status: DC
  Administered 2015-08-17 – 2015-08-20 (×3): via INTRAVENOUS
  Administered 2015-08-20: 75 mL/h via INTRAVENOUS
  Administered 2015-08-21: 10:00:00 via INTRAVENOUS

## 2015-08-17 MED ORDER — CLONIDINE HCL 0.3 MG/24HR TD PTWK
0.3000 mg | MEDICATED_PATCH | TRANSDERMAL | Status: DC
  Administered 2015-08-17: 0.3 mg via TRANSDERMAL
  Filled 2015-08-17: qty 1

## 2015-08-17 MED ORDER — ENALAPRILAT 1.25 MG/ML IV SOLN
1.2500 mg | Freq: Four times a day (QID) | INTRAVENOUS | Status: DC | PRN
  Administered 2015-08-17 – 2015-08-20 (×6): 1.25 mg via INTRAVENOUS
  Filled 2015-08-17 (×6): qty 2

## 2015-08-17 MED ORDER — HYDRALAZINE HCL 20 MG/ML IJ SOLN
5.0000 mg | Freq: Four times a day (QID) | INTRAMUSCULAR | Status: DC | PRN
  Administered 2015-08-17 – 2015-08-19 (×5): 5 mg via INTRAVENOUS
  Filled 2015-08-17 (×6): qty 1

## 2015-08-17 NOTE — Evaluation (Signed)
Physical Therapy Evaluation Patient Details Name: Brooke Simpson MRN: [id number] DOB: 08/01/1915 Today's Date: 08/17/2015   History of Present Illness  Pt is a [age]year old white female who has been having a progressive decline in mobilty over the past few months, limited to transfers only 1 month ago. Pt sustained an episode of syncope at home, caught by sister, noted to have L sided weakness and was brought in, found to have CVA. At eval , family reports that LLE and LUE weakness appear to have improved substancially,.   Clinical Impression  Pt received supine in bed and lethargic with family at bedside. Lethargy and dysarthria limiting ability to communicate with patient 90% of time, but family is able to assist with history. Pt notable quite weak, with impaired strength, sensation, and coordination on the Left side, including some L facial weakness. Pt will benefit from skilled PT services address the above deficits to improve independence in mobility and transfers, to provide family education, and to decrease caregiver burden.    Follow Up Recommendations Home health PT    Equipment Recommendations  Hospital bed    Recommendations for Other Services       Precautions / Restrictions Precautions Precautions: None Restrictions Weight Bearing Restrictions: No      Mobility  Bed Mobility               General bed mobility comments: Pt declines due to malaise and lethargy, family in support; will attempt at next visit.   Transfers                 General transfer comment: Pt declines due to malaise and lethargy, family in support; will attempt at next visit.   Ambulation/Gait                Stairs            Wheelchair Mobility    Modified Rankin (Stroke Patients Only)       Balance                                             Pertinent Vitals/Pain Pain Assessment: No/denies pain    Home Living Family/patient expects to be  discharged to:: Private residence Living Arrangements: Other relatives Available Help at Discharge: Family;Personal care attendant;Available 24 hours/day (Previously receiving help from [facility]. ) Type of Home: House Home Access: Stairs to enter Entrance Stairs-Rails: Doctor, general practiceight;Left Entrance Stairs-Number of Steps: 3 Home Layout: One level Home Equipment: Walker - 2 wheels;Cane - single point;Wheelchair - manual Additional Comments: Previously navigating steps/household mobility in WC.     Prior Function Level of Independence: Needs assistance   Gait / Transfers Assistance Ne[facility]. W/C for most mobility.   ADL's / Homemaking Assistance Needed: Previosuly able to self feed, required assistance for all additional ADL.         Hand Dominance   Dominant Hand: Right    Extremity/Trunk Assessment   Upper Extremity Assessment: LUE deficits/detail;RUE deficits/detail;Generalized weakness RUE Deficits / Details: Generally weak, relluctant to attempt HHA or MMT due to cconcerns of easy bruising. Grip strength is very weak.      LUE Deficits / Details: Sensation above the wrist likely WNL, pt confused by sensation testing at hand. When cued to touch nose with L index finger, brings finger to chin and stops.  Lower Extremity Assessment: Generalized weakness;RLE deficits/detail;LLE deficits/detail   LLE Deficits / Details: Severe weakenss, limited ability to perform SAQx1 or composite LLE extension gravity assisted.      Communication   Communication: Expressive difficulties (Severe dysarthria, delayed speech, delayed response, lethargy.)  Cognition Arousal/Alertness: Lethargic Behavior During Therapy: WFL for tasks assessed/performed (Uninterested in performing mobility at this time due to feeling generally poor. ) Overall Cognitive Status: Difficult to assess                      General Comments      Exercises         Assessment/Plan    PT Assessment Patient needs continued PT services  PT Diagnosis Generalized weakness;Hemiplegia non-dominant side   PT Problem List Decreased strength;Decreased range of motion;Decreased activity tolerance;Decreased mobility;Decreased coordination;Impaired sensation  PT Treatment Interventions Functional mobility training;Therapeutic exercise;Therapeutic activities;Balance training;Patient/family education   PT Goals (Current goals can be found in the Care Plan section) Acute Rehab PT Goals Patient Stated Goal: Return to home at all costs.  PT Goal Formulation: With family Time For Goal Achievement: 08/31/15 Potential to Achieve Goals: Fair    Frequency 7X/week   Barriers to discharge   Pt would benefit from hospital bed at D/C.     Co-evaluation               End of Session   Activity Tolerance: Patient limited by fatigue;Patient limited by lethargy Patient left: in bed;with family/visitor present;with bed alarm set Nurse Communication: Mobility status;Other (comment)         Time: 7564-3329 PT Time Calculation (min) (ACUTE ONLY): 20 min   Charges:   PT Evaluation $Initial PT Evaluation Tier I: 1 Procedure     PT G Codes:        Previn Jian C 08/30/2015, 10:33 AM  10:37 AM  Rosamaria Lints, PT, DPT [place] License # [id number]

## 2015-08-17 NOTE — Progress Notes (Signed)
OT Cancellation Note  Patient Details Name: Abigail Buttslsie C Mackie MRN: [id number] DOB: 1915-01-27   Cancelled Treatment:     Reason evaluation not completed: Pt unavailable. Pt is at MRI. Will attempt evaluation again later.   Ezra SitesLeslie Troxler, OTR/L  [phone]  08/17/2015, 8:41 AM

## 2015-08-17 NOTE — Care Management Important Message (Signed)
Important Message  Patient Details  Name: Brooke Simpson MRN: [id number] Date of Birth: Mar 16, 1915   Medicare Important Message Given:  Yes-second notification given    Cheryl FlashBlackwell, Rhilynn Preyer Crowder, RN 08/17/2015, 11:18 AM

## 2015-08-17 NOTE — Progress Notes (Signed)
OT Cancellation Note  Patient Details Name: Brooke Simpson MRN: [id number] DOB: 1915/09/30   Cancelled Treatment:     Reason evaluation not completed: Chart reviewed. OT services are not appropriate at this time as pt is total care at baseline and has 24 hour caregivers to provide assistance with ADL tasks. Pt has all necessary DME, and hospital bed is being secured. Pt family is considering hospice at discharge.   Ezra SitesLeslie Troxler, OTR/L  [phone]  08/17/2015, 3:43 PM

## 2015-08-17 NOTE — Plan of Care (Signed)
Problem: Acute Rehab PT Goals(only PT should resolve) Goal: Pt Will Go Supine/Side To Sit Pt will demonstrate MinA bed mobility supine to/from sitting edge-of-bed to return to PLOF in independence level and to decrease caregiver burden.     Goal: Patient Will Perform Sitting Balance Pt will demonstrate tolerance of 3-5  Minutes balance training activities at supervision to improve functional trunk stability.

## 2015-08-17 NOTE — Progress Notes (Signed)
81 year old pleasant woman with significant hypertension controlled on 5 medicines most recently systolic was in the 140 range in the office who's had a right middle cerebral artery thrombosis with accompanying left arm weakness and altered mental status neurology consultation has been called for her she is on full dose aspirin and we'll switch to Catapres patch TTS 3 and intravenous Vasotec every 6 hours when necessary for antihypertensive control swallowing study will be performed Brooke Simpson WGN:[id number] DOB: August 27, 1915 DOA: 08/16/2015 PCP: Isabella Stalling, MD             Physical Exam: Blood pressure 209/70, pulse 75, temperature 98.5 F (36.9 C), temperature source Axillary, resp. rate 20, height  (1.575 m), weight 89 lb 15.2 oz (40.8 kg), SpO2 99 %. neck no JVD no carotid bruits no thyromegaly no carotid bruits heart regular rhythm for over 6 aortic murmur no S3-S4 no heaves thrills rubs abdomen soft nontender bowel sounds normoactive no guarding or rebound masses or megaly lungs clear to A&P no rales wheeze rhonchi with diminished breath sounds in the bases neurologic some left arm weakness patient does not fell full commands plantars are equivocal bilaterally left leg is 4-5 motor strength right arm 2 out of 5 motor strength   Investigations:  No results found for this or any previous visit (from the past 240 hour(s)).   Basic Metabolic Panel:  Recent Labs  78/46/96 1620 08/16/15 2058  NA 136 139  K 3.8 4.2  CL 103 110  CO2 23 24  GLUCOSE 145* 141*  BUN 24* 34*  CREATININE 1.05* 1.40*  CALCIUM 8.8* 8.5*   Liver Function Tests:  Recent Labs  08/14/15 1620 08/16/15 2058  AST 23 29  ALT 19 17  ALKPHOS 104 107  BILITOT 1.1 0.4  PROT 6.6 6.1*  ALBUMIN 3.2* 2.7*     CBC:  Recent Labs  08/14/15 1620 08/16/15 2058  WBC 15.5* 10.3  NEUTROABS 12.2* 7.8*  HGB 10.6* 9.5*  HCT 33.0* 30.6*  MCV 101.5* 105.2*  PLT 268 221    Ct Head Wo  Contrast  08/16/2015  CLINICAL DATA:  Altered level of consciousness and sudden onset of left-sided weakness. EXAM: CT HEAD WITHOUT CONTRAST TECHNIQUE: Contiguous axial images were obtained from the base of the skull through the vertex without contrast. COMPARISON:  05/21/2012 FINDINGS: Age appropriate atrophy with mild dilatation of the lateral ventricles. Again noted is diffuse low density throughout the periventricular white matter which is unchanged. No evidence for acute hemorrhage, mass lesion, midline shift, hydrocephalus or large infarct. Patient has extensive paranasal sinus disease. Near complete opacification of the sphenoid sinuses. Complete opacification in the left maxillary sinus. Extensive opacification in the bilateral ethmoid air cells, left side greater than right. Complete opacification of the left frontal sinus. Mastoid air cells are clear. No acute bone abnormality. IMPRESSION: No acute intracranial abnormality. Stable atrophy with white matter changes. White matter changes may represent chronic small vessel ischemic disease. Extensive paranasal sinus disease as described. These results were called by telephone at the time of interpretation on 08/16/2015 at 9:07 pm to Dr. Linwood Dibbles , who verbally acknowledged these results. Electronically Signed   By: Richarda Overlie M.D.   On: 08/16/2015 21:07   Mr Brooke Simpson Head Wo Contrast  08/17/2015  CLINICAL DATA:  81 year old female with left side weakness and confusion. Initial encounter. EXAM: MRA HEAD WITHOUT CONTRAST TECHNIQUE: Angiographic images of the Circle of Willis were obtained using MRA technique without intravenous contrast. COMPARISON:  Brain MRI from 0816 hours today reported separately. Head CT 08/16/2015. FINDINGS: Mildly degraded by motion despite repeated imaging attempts. Antegrade flow in the posterior circulation with codominant appearing distal vertebral arteries. Patent basilar artery without stenosis. SCA and PCA origins appear  patent. Bilateral PCA branches are within normal limits. Posterior communicating arteries are diminutive or absent. Antegrade flow in both ICA siphons. No siphon stenosis. Cavernous and supraclinoid irregularity in keeping with atherosclerosis, greater on the left. Patent carotid termini. Normal ACA and MCA origins. Left MCA M1 segment, bifurcation, and left MCA branches are within normal limits. Anterior communicating artery is diminutive or absent. Bilateral ACA branches are within normal limits. The right MCA origin and M1 segment are patent. The right MCA bifurcation is patent. The dominant anterior M2 division is occluded about 9 mm from its origin. The other visualized right MCA branches are within normal limits. IMPRESSION: 1. Anterior right MCA M2 branch occlusion about 9 mm beyond the right MCA bifurcation. See MRI from today reported separately. 2. Otherwise negative intracranial MRA. Electronically Signed   By: Odessa Fleming M.D.   On: 08/17/2015 08:53   Mr Brain Wo Contrast  08/17/2015  CLINICAL DATA:  Left-sided weakness and confusion. EXAM: MRI HEAD WITHOUT CONTRAST TECHNIQUE: Multiplanar, multiecho pulse sequences of the brain and surrounding structures were obtained without intravenous contrast. COMPARISON:  CT head without contrast 08/16/2015. FINDINGS: Acute nonhemorrhagic infarct is evident within the anterior left right MCA distribution. This involves the right frontal operculum and anterior aspect of the insular cortex. The primary motor cortex is spared. The basal ganglia are intact. Atrophy and diffuse chronic white matter change is noted. White matter disease extends into the brainstem. Flow is present in the major intracranial arteries. The globes and orbits are intact. There is extensive opacification of left anterior ethmoid air cells. The sphenoid sinuses are opacified bilaterally. Scattered right ethmoid sinus disease is present. The right maxillary sinus spared. The mastoid air cells are  clear. IMPRESSION: 1. Acute nonhemorrhagic infarct involving the right frontal operculum in anterior insular cortex compatible with focal obstruction of the anterior right M2 division seen on the MRA. 2. Extensive atrophy and white matter disease compatible with chronic microvascular disease. 3. Diffuse sinus disease. Electronically Signed   By: Marin Roberts M.D.   On: 08/17/2015 08:56   Dg Chest Portable 1 View  08/16/2015  CLINICAL DATA:  Syncope. Drowsiness and disorientation, acute onset. Initial encounter. EXAM: PORTABLE CHEST 1 VIEW COMPARISON:  Chest radiograph from 08/14/2015 FINDINGS: The lungs are hyperexpanded, with flattening of the hemidiaphragms, compatible with COPD. A small left pleural effusion is noted, with left basilar airspace opacity. There is no evidence of pneumothorax. The cardiomediastinal silhouette is borderline normal in size. No acute osseous abnormalities are seen. IMPRESSION: Small left pleural effusion, with left basilar airspace opacity. This could reflect pneumonia. Underlying findings of COPD. Electronically Signed   By: Roanna Raider M.D.   On: 08/16/2015 22:27      Medication  Impression:  Principal Problem:   Syncope Active Problems:   Altered mental status   Left-sided weakness   Dehydration   COPD (chronic obstructive pulmonary disease) (HCC)   Chronic respiratory failure with hypoxia (HCC)   Cerebral thrombosis with cerebral infarction     Plan: Full dose aspirin orally or rectally will add Catapres-TTS-3 and Vasotec IV 1.25 mg every 6 hours when necessary for antihypertensive control. Neurology consultation requested speech therapy and swallowing evaluation requested B med daily dementia panel ordered with  TSH carotid ultrasound and echo ordered  Consultants: Neurology requested   Procedures   Antibiotics:                   Code Status: No code DO NOT RESUSCITATE   Family Communication:  Spoke with 4 family members in  room  Disposition Plan see plan above  Time spent: 40 minutes   LOS: 1 day   Brooke Simpson M   08/17/2015, 12:42 PM

## 2015-08-17 NOTE — Care Management Note (Signed)
Case Management Note  Patient Details  Name: Abigail Buttslsie C Corbridge MRN: [id number] Date of Birth: [birth year]  Subjective/Objective:                  Pt admitted from home with CVA, Pt lives at home with 24 hour caregivers in the home. Pt has rollator, cane, BSC, and w/c in the home. Pt is total care pt at this time.  Action/Plan: Pts nephew, who is HPOA, is interested in hospice at discharge. Will discuss with Dr. Janna Archondiego. Gave list of hospice facilities to pts nephew. Would like [facility]. [facility]. WIll continue to follow.  Expected Discharge Date:                  Expected Discharge Plan:  Home/Self Care  In-House Referral:  NA  Discharge planning Services  CM Consult  Post Acute Care Choice:  Durable Medical Equipment Choice offered to:  Patient  DME Arranged:  Hospital bed DME Agency:  [facility]  [facility]     Status of Service:  In process, will continue to follow  Medicare Important Message Given:  Yes-second notification given Date Medicare IM Given:    Medicare IM give by:    Date Additional Medicare IM Given:    Additional Medicare Important Message give by:     If discussed at Long Length of Stay Meetings, dates discussed:    Additional Comments:  Cheryl FlashBlackwell, Akeyla Molden Crowder, RN 08/17/2015, 11:24 AM

## 2015-08-17 NOTE — Progress Notes (Signed)
Speech in room evaluating patient.

## 2015-08-17 NOTE — Progress Notes (Signed)
Pt complaining of issues falling asleep. Spoke with Dr.Gosrani who ordered a one time dose of Ambien 5 mg. Pt BP also 176/64 with a HR of 65. Dr Karilyn CotaGosrani ordered hydralazine 5 mg IV Q6 PRN. Will continue to monitor

## 2015-08-17 NOTE — Progress Notes (Signed)
Initial Nutrition Assessment  DOCUMENTATION CODES:  Underweight, Severe malnutrition in context of chronic illness  INTERVENTION:  Follow pt work up and goals of care  If pt cleared for oral diet will revisit to discuss oral supplements  NUTRITION DIAGNOSIS:  Inadequate oral intake related to poor appetite as evidenced by loss of >8% bw in 1 month, severe depletion of muscle/fat and an estimated energy intake < 75% needs for > or equal to 1 month.  GOAL:  Patient will meet greater than or equal to 90% of their needs  MONITOR:  Diet advancement, Labs, I & O's, Work up, goals of care  REASON FOR ASSESSMENT:   Malnutrition Screening Tool    ASSESSMENT:  81 y/o female PMHx HTN, hypothyroidism brought to ED for progressively worsening po intake and weakness. Yesterday, she suddenly became unresponsive and slumped over. Admitted for work up of possible stroke.   RD spoke with family members. They report that her decline started 5 weeks ago. She was generally healthy and slowly started to drink/eat less and less. They report that the pt would not drink/eat because she didn't want to go to the bathroom. She received IVF in the ED 2 days ago due to dehydration  Currently being worked up for stroke and waiting on Speech Eval. Given pt's current condtion, felt appropriate to ask if they knew what her wishes would be if she failed speech eval. They state they have not thought about this though patient has stated she did not want aggressive measures. She is a DNR.    They report that pt weighed 97 lbs 5 weeks ago. This would be a loss of >8% bw in 5 weeks  NFPE: Severe upper body fat/muscle wasting noted. Very sunken face. Pt reaching out for family members  Diet Order:  Diet NPO time specified  Skin:  Ecchymotic  Last BM:  10/27  Height:  Ht Readings from Last 1 Encounters:  08/16/15 5\' 2"  (1.575 m)   Weight:  Wt Readings from Last 1 Encounters:  08/16/15 89 lb 15.2 oz (40.8 kg)    Wt Readings from Last 10 Encounters:  08/16/15 89 lb 15.2 oz (40.8 kg)  08/17/15 89 lb (40.37 kg)  08/14/15 97 lb (43.999 kg)  05/21/12 97 lb (43.999 kg)   Ideal Body Weight:  50 kg  BMI:  Body mass index is 16.45 kg/(m^2).  Estimated Nutritional Needs:  Kcal:  1100-1250 (27-31 kcal/kg) Protein:  50-60 g (1-1.2 g/kg IBW) Fluid:  1.1-1.3 Liters  EDUCATION NEEDS:  No education needs identified at this time  Brooke Simpson RD, LDN Nutrition Pager: [phone] 08/17/2015 3:01 PM

## 2015-08-17 NOTE — Progress Notes (Signed)
Patient given aspirin 325mg  crushed with applesauce.  Talking to family and moving arms, more alert.  Will continue to monitor blood pressure.

## 2015-08-17 NOTE — Evaluation (Signed)
Clinical/Bedside Swallow Evaluation Patient Details  Name: Brooke Simpson MRN: [id number] Date of Birth: [birth year]  Today's Date: 08/17/2015 Time: SLP Start Time (ACUTE ONLY): 1615 SLP Stop Time (ACUTE ONLY): 1645 SLP Time Calculation (min) (ACUTE ONLY): 30 min  Past Medical History:  Past Medical History  Diagnosis Date  . Hypertension   . Irregular heartbeat   . Hypothyroid    Past Surgical History: History reviewed. No pertinent past surgical history. HPI:  Brooke Simpson is a 81 yo female h/o HTN lives at home but has 24 hour care arranged by family brought in tonight for progressive worsening po intake, weakness for the past week.she has been in the ED and seen her PCP dr Brooke Simpson in the last several days for generalized weakness. 2 days ago in the ED, her work up was unrevealing. At her baseline she walks around well, mentates normally per family. Then 10/27 she all of a sudden became unresponsive, slumped over. 911 was called. When she awoke, she was having left side hemiparesis. MRI showed CVA (Acute nonhemorrhagic infarct involving the right frontal operculum in anterior insular cortex compatible with focal obstruction of the anterior right M2 division seen on the MRA).   Assessment / Plan / Recommendation Clinical Impression  Pt presents with mild/moderate oropharyngeal dysphagia onset following MRI showing Acute nonhemorrhagic infarct involving the right frontal operculum in anterior insular cortex compatible with focal obstruction of the anterior right M2 division seen on the MRA. With ice and thin trials, the pt demonstrated oral holding, piecemeal deglutition, immediate coughing, wet vocal quality and delayed throat clears with all trials by spoon. Nectar trials by spoon, cup and straw demonstrated piecemeal deglution and mild occasional wet vocal quality cleared with cues. Pt was unable to masticate and formulate bolus for regluar trials resulting in expectoration of cracker after  attempted bolus prep. Recommend D1 (puree)/NTL and meds to be administered crushed in puree. Straws permited with NTL. ST to follow for diet tolerance.    Aspiration Risk  Moderate    Diet Recommendation Dysphagia 1 (Puree);Nectar   Medication Administration: Crushed with puree Compensations: Slow rate;Small sips/bites;Clear throat intermittently;Multiple dry swallows after each bite/sip;Follow solids with liquid;Minimize environmental distractions    Other  Recommendations Oral Care Recommendations: Oral care BID Other Recommendations: Remove water pitcher;Order thickener from pharmacy   Follow Up Recommendations       Frequency and Duration min 2x/week  2 weeks   Pertinent Vitals/Pain     SLP Swallow Goals     Swallow Study Prior Functional Status       General Date of Onset: 08/16/15 Other Pertinent Information: Brooke Simpson is a 81 yo female h/o HTN lives at home but has 24 hour care arranged by family brought in tonight for progressive worsening po intake, weakness for the past week.she has been in the ED and seen her PCP dr Brooke Simpson in the last several days for generalized weakness. 2 days ago in the ED, her work up was unrevealing. At her baseline she walks around well, mentates normally per family. Then 10/27 she all of a sudden became unresponsive, slumped over. 911 was called. When she awoke, she was having left side hemiparesis. MRI showed CVA (Acute nonhemorrhagic infarct involving the right frontal operculum in anterior insular cortex compatible with focal obstruction of the anterior right M2 division seen on the MRA). Diet Prior to this Study: NPO Temperature Spikes Noted: No History of Recent Intubation: No Oral Cavity - Dentition: Adequate natural dentition/normal for age Self-Feeding  Abilities: Needs assist;Needs set up;Able to feed self Patient Positioning: Upright in bed Baseline Vocal Quality: Normal Volitional Cough: Strong Volitional Swallow: Able to  elicit    Oral/Motor/Sensory Function Overall Oral Motor/Sensory Function: Appears within functional limits for tasks assessed   Ice Chips Ice chips: Impaired Presentation: Spoon Oral Phase Impairments: Reduced labial seal;Reduced lingual movement/coordination Pharyngeal Phase Impairments: Cough - Immediate;Throat Clearing - Delayed;Wet Vocal Quality   Thin Liquid Thin Liquid: Impaired Presentation: Spoon Oral Phase Functional Implications: Right anterior spillage;Left anterior spillage;Oral holding Pharyngeal  Phase Impairments: Wet Vocal Quality;Cough - Immediate;Multiple swallows    Nectar Thick Nectar Thick Liquid: Impaired Presentation: Cup;Spoon Oral Phase Impairments: Reduced labial seal Oral phase functional implications: Oral holding Pharyngeal Phase Impairments: Multiple swallows;Wet Vocal Quality;Throat Clearing - Delayed   Honey Thick Honey Thick Liquid: Not tested   Puree Puree: Within functional limits   Solid   GO    Solid: Impaired Presentation: Self Fed Oral Phase Impairments: Reduced labial seal;Poor awareness of bolus;Impaired mastication;Impaired anterior to posterior transit Oral Phase Functional Implications: Oral residue Pharyngeal Phase Impairments: Unable to trigger swallow (pt expectorated cracker/unable to swallow)      [name] MA, CCC-SLP Speech Language Pathologist    Georgetta HaberAmelia H Yarbrough 08/17/2015,5:13 PM

## 2015-08-17 NOTE — Progress Notes (Signed)
Central telemetry notified of patient going for MRI, IV saline locked.  Down via stretcher.  Family in room.

## 2015-08-18 ENCOUNTER — Inpatient Hospital Stay (HOSPITAL_COMMUNITY): Payer: Medicare Other

## 2015-08-18 DIAGNOSIS — E43 Unspecified severe protein-calorie malnutrition: Secondary | ICD-10-CM | POA: Insufficient documentation

## 2015-08-18 DIAGNOSIS — I6789 Other cerebrovascular disease: Secondary | ICD-10-CM

## 2015-08-18 LAB — BASIC METABOLIC PANEL
Anion gap: 11 (ref 5–15)
BUN: 20 mg/dL (ref 6–20)
CALCIUM: 8.3 mg/dL — AB (ref 8.9–10.3)
CO2: 20 mmol/L — AB (ref 22–32)
CREATININE: 0.9 mg/dL (ref 0.44–1.00)
Chloride: 111 mmol/L (ref 101–111)
GFR calc Af Amer: 60 mL/min — ABNORMAL LOW (ref >60)
GFR, EST NON AFRICAN AMERICAN: 52 mL/min — AB (ref >60)
GLUCOSE: 83 mg/dL (ref 65–99)
Potassium: 2.8 mmol/L — ABNORMAL LOW (ref 3.5–5.1)
Sodium: 142 mmol/L (ref 135–145)

## 2015-08-18 LAB — RPR: RPR: NONREACTIVE

## 2015-08-18 MED ORDER — POTASSIUM CHLORIDE 10 MEQ/100ML IV SOLN
10.0000 meq | Freq: Once | INTRAVENOUS | Status: AC
  Administered 2015-08-18: 10 meq via INTRAVENOUS
  Filled 2015-08-18: qty 100

## 2015-08-18 MED ORDER — POTASSIUM CHLORIDE 10 MEQ/100ML IV SOLN
10.0000 meq | INTRAVENOUS | Status: AC
  Administered 2015-08-18 (×4): 10 meq via INTRAVENOUS
  Filled 2015-08-18 (×3): qty 100

## 2015-08-18 NOTE — Progress Notes (Signed)
Physical Therapy Treatment Patient Details Name: Brooke Simpson MRN: [id number] DOB: 1915/10/02 Today's Date: 08/18/2015    History of Present Illness Pt is a 81year old white female who has been having a progressive decline in mobilty over the past few months, limited to transfers only 1 month ago. Pt sustained an episode of syncope at home, caught by sister, noted to have L sided weakness and was brought in, found to have CVA. At eval , family reports that LLE and LUE weakness appear to have improved substancially,.     PT Comments    Pt willing to participate, somewhat lethargic family reports pt given sleep medication last night.  Able to complete AAROM for ROM and strengthening.  Pt responsed well towards family member who assisted with communication this session.  Does continue to have dyspnea and babbling somewhat, difficult to understand 60% of the time.  Pt unable to swallow spit, RN called for suction to assist.  Did not complete bed mobility this session due to family concerned with chocking prior suction.  Family was instructed importance of completing bed mobility to reduce pressure sores, able to verbalize correct technique with no questions.  Pillow placed under Bil ankles for pressure relief on heels.  Pt left in bed with family in room.  Follow Up Recommendations        Equipment Recommendations       Recommendations for Other Services       Precautions / Restrictions Precautions Precautions: None Restrictions Weight Bearing Restrictions: No    Mobility  Bed Mobility               General bed mobility comments: Unable to complete due to swallow difficulties, family scared she would asperate, RN informed and suction complete.  Family was instructed technique and importance of completeing hourly to reduce risk of pressure sores.  Pillow was placed under ankles for heel pressure relief.  Transfers                    Ambulation/Gait                  Stairs            Wheelchair Mobility    Modified Rankin (Stroke Patients Only)       Balance                                    Cognition Arousal/Alertness: Lethargic Behavior During Therapy: WFL for tasks assessed/performed Overall Cognitive Status: Difficult to assess                      Exercises General Exercises - Upper Extremity Shoulder Flexion: AAROM;Both;10 reps;Supine General Exercises - Lower Extremity Ankle Circles/Pumps: AAROM;Both;10 reps;Supine Heel Slides: AAROM;Both;Supine Hip ABduction/ADduction: AAROM;Both;10 reps;Supine    General Comments        Pertinent Vitals/Pain Pain Assessment: No/denies pain    Home Living                      Prior Function            PT Goals (current goals can now be found in the care plan section) Acute Rehab PT Goals Patient Stated Goal: Return to home at all costs.  Progress towards PT goals: Progressing toward goals    Frequency       PT Plan Current plan  remains appropriate    Co-evaluation             End of Session   Activity Tolerance: Patient tolerated treatment well;Patient limited by fatigue;Patient limited by lethargy Patient left: in bed;with call bell/phone within reach;with bed alarm set;with nursing/sitter in room     Time: 1033-1120 PT Time Calculation (min) (ACUTE ONLY): 47 min  Charges:  $Therapeutic Exercise: 8-22 mins $Therapeutic Activity: 8-22 mins                    G Codes:      Juel BurrowCockerham, Michaelene Dutan Jo 08/18/2015, 11:30 AM

## 2015-08-18 NOTE — Progress Notes (Signed)
  Echocardiogram 2D Echocardiogram has been performed.  Brooke Simpson, Brooke Simpson A 08/18/2015, 12:49 PM

## 2015-08-18 NOTE — Progress Notes (Signed)
Potassium is 2.8 we'll replenish with KCl 10 mEq IV 4 carotid duplex reveals no greater than 50% stenosis bilaterally 2-D echocardiogram ordered this morning no neurology note as of yet patient currently on full dose aspirin Brooke Simpson VQQ:[id number] DOB: 1915/05/08 DOA: 08/16/2015 PCP: Brooke Curet, MD             Physical Exam: Blood pressure 162/83, pulse 99, temperature 101 F (38.3 C), temperature source Axillary, resp. rate 16, height 5' 2"  (1.575 m), weight 89 lb 15.2 oz (40.8 kg), SpO2 99 %. lungs clear to A&P no rales wheeze rhonchi heart regular rhythm without murmurs  Murmur no S3-S4 no heaves thrills rubs abdomen soft nontender bowel sounds normoactive no guarding or rebound neurologic left arm weakness 2 out of 5 motor strength left leg for 5 motor strength patient alert oriented in 3 spheres some dysarthria noted  Investigations:  No results found for this or any previous visit (from the past 240 hour(s)).   Basic Metabolic Panel:  Recent Labs  08/16/15 2058 08/18/15 0619  NA 139 142  K 4.2 2.8*  CL 110 111  CO2 24 20*  GLUCOSE 141* 83  BUN 34* 20  CREATININE 1.40* 0.90  CALCIUM 8.5* 8.3*   Liver Function Tests:  Recent Labs  08/16/15 2058  AST 29  ALT 17  ALKPHOS 107  BILITOT 0.4  PROT 6.1*  ALBUMIN 2.7*     CBC:  Recent Labs  08/16/15 2058 08/17/15 1551  WBC 10.3 11.7*  NEUTROABS 7.8*  --   HGB 9.5* 10.0*  HCT 30.6* 31.3*  MCV 105.2* 101.3*  PLT 221 256    Ct Head Wo Contrast  08/16/2015  CLINICAL DATA:  Altered level of consciousness and sudden onset of left-sided weakness. EXAM: CT HEAD WITHOUT CONTRAST TECHNIQUE: Contiguous axial images were obtained from the base of the skull through the vertex without contrast. COMPARISON:  05/21/2012 FINDINGS: Age appropriate atrophy with mild dilatation of the lateral ventricles. Again noted is diffuse low density throughout the periventricular white matter which is unchanged. No  evidence for acute hemorrhage, mass lesion, midline shift, hydrocephalus or large infarct. Patient has extensive paranasal sinus disease. Near complete opacification of the sphenoid sinuses. Complete opacification in the left maxillary sinus. Extensive opacification in the bilateral ethmoid air cells, left side greater than right. Complete opacification of the left frontal sinus. Mastoid air cells are clear. No acute bone abnormality. IMPRESSION: No acute intracranial abnormality. Stable atrophy with white matter changes. White matter changes may represent chronic small vessel ischemic disease. Extensive paranasal sinus disease as described. These results were called by telephone at the time of interpretation on 08/16/2015 at 9:07 pm to Dr. Dorie Simpson , who verbally acknowledged these results. Electronically Signed   By: Brooke Simpson M.D.   On: 08/16/2015 21:07   Mr Brooke Simpson Head Wo Contrast  08/17/2015  CLINICAL DATA:  81 year old female with left side weakness and confusion. Initial encounter. EXAM: MRA HEAD WITHOUT CONTRAST TECHNIQUE: Angiographic images of the Circle of Willis were obtained using MRA technique without intravenous contrast. COMPARISON:  Brain MRI from 0816 hours today reported separately. Head CT 08/16/2015. FINDINGS: Mildly degraded by motion despite repeated imaging attempts. Antegrade flow in the posterior circulation with codominant appearing distal vertebral arteries. Patent basilar artery without stenosis. SCA and PCA origins appear patent. Bilateral PCA branches are within normal limits. Posterior communicating arteries are diminutive or absent. Antegrade flow in both ICA siphons. No siphon stenosis. Cavernous and supraclinoid irregularity in  keeping with atherosclerosis, greater on the left. Patent carotid termini. Normal ACA and MCA origins. Left MCA M1 segment, bifurcation, and left MCA branches are within normal limits. Anterior communicating artery is diminutive or absent. Bilateral ACA  branches are within normal limits. The right MCA origin and M1 segment are patent. The right MCA bifurcation is patent. The dominant anterior M2 division is occluded about 9 mm from its origin. The other visualized right MCA branches are within normal limits. IMPRESSION: 1. Anterior right MCA M2 branch occlusion about 9 mm beyond the right MCA bifurcation. See MRI from today reported separately. 2. Otherwise negative intracranial MRA. Electronically Signed   By: Brooke Simpson M.D.   On: 08/17/2015 08:53   Mr Brain Wo Contrast  08/17/2015  CLINICAL DATA:  Left-sided weakness and confusion. EXAM: MRI HEAD WITHOUT CONTRAST TECHNIQUE: Multiplanar, multiecho pulse sequences of the brain and surrounding structures were obtained without intravenous contrast. COMPARISON:  CT head without contrast 08/16/2015. FINDINGS: Acute nonhemorrhagic infarct is evident within the anterior left right MCA distribution. This involves the right frontal operculum and anterior aspect of the insular cortex. The primary motor cortex is spared. The basal ganglia are intact. Atrophy and diffuse chronic white matter change is noted. White matter disease extends into the brainstem. Flow is present in the major intracranial arteries. The globes and orbits are intact. There is extensive opacification of left anterior ethmoid air cells. The sphenoid sinuses are opacified bilaterally. Scattered right ethmoid sinus disease is present. The right maxillary sinus spared. The mastoid air cells are clear. IMPRESSION: 1. Acute nonhemorrhagic infarct involving the right frontal operculum in anterior insular cortex compatible with focal obstruction of the anterior right M2 division seen on the MRA. 2. Extensive atrophy and white matter disease compatible with chronic microvascular disease. 3. Diffuse sinus disease. Electronically Signed   By: Brooke Simpson M.D.   On: 08/17/2015 08:56   US Carotid Bilateral  08/18/2015  CLINICAL DATA:  Stroke.   Hypertension. EXAM: BILATERAL CAROTID DUPLEX ULTRASOUND TECHNIQUE: Pearline Cables scale imaging, color Doppler and duplex ultrasound was performed of bilateral carotid and vertebral arteries in the neck. COMPARISON:  None. REVIEW OF SYSTEMS: Quantification of carotid stenosis is based on velocity parameters that correlate the residual internal carotid diameter with NASCET-based stenosis levels, using the diameter of the distal internal carotid lumen as the denominator for stenosis measurement. The following velocity measurements were obtained: PEAK SYSTOLIC/END DIASTOLIC RIGHT ICA:                     103/12cm/sec CCA:                     76/7HA/LPF SYSTOLIC ICA/CCA RATIO:  1.4 DIASTOLIC ICA/CCA RATIO: 2.1 ECA:                     72cm/sec LEFT ICA:                     96/9cm/sec CCA:                     79/0WI/OXB SYSTOLIC ICA/CCA RATIO:  1.4 DIASTOLIC ICA/CCA RATIO: 1.5 ECA:                     83cm/sec FINDINGS: RIGHT CAROTID ARTERY: Circumferential plaque in the carotid bulb and proximal ICA resulting in at least mild stenosis. Normal waveforms and color Doppler signal. RIGHT VERTEBRAL ARTERY:  Normal flow direction and  waveform. LEFT CAROTID ARTERY: Circumferential mild partially calcified plaque in the bulb, extending to proximal internal and external carotid arteries. No high-grade stenosis. Normal waveforms and color Doppler signal. A nonspecific cardiac arrhythmias noted. LEFT VERTEBRAL ARTERY: Normal flow direction and waveform. IMPRESSION: 1. Bilateral carotid bifurcation and proximal ICA plaque, resulting in less than 50% diameter stenosis. The exam does not exclude plaque ulceration or embolization. Continued surveillance recommended. Electronically Signed   By: Lucrezia Europe M.D.   On: 08/18/2015 08:06   Dg Chest Portable 1 View  08/16/2015  CLINICAL DATA:  Syncope. Drowsiness and disorientation, acute onset. Initial encounter. EXAM: PORTABLE CHEST 1 VIEW COMPARISON:  Chest radiograph from 08/14/2015 FINDINGS:  The lungs are hyperexpanded, with flattening of the hemidiaphragms, compatible with COPD. A small left pleural effusion is noted, with left basilar airspace opacity. There is no evidence of pneumothorax. The cardiomediastinal silhouette is borderline normal in size. No acute osseous abnormalities are seen. IMPRESSION: Small left pleural effusion, with left basilar airspace opacity. This could reflect pneumonia. Underlying findings of COPD. Electronically Signed   By: Garald Balding M.D.   On: 08/16/2015 22:27      Medications:   Impression: Hypokalemia  Principal Problem:   Syncope Active Problems:   Altered mental status   Left-sided weakness   Dehydration   COPD (chronic obstructive pulmonary disease) (HCC)   Chronic respiratory failure with hypoxia (HCC)   Cerebral thrombosis with cerebral infarction   Protein-calorie malnutrition, severe     Plan: KCl 10 mEq IV 4, continue physical therapy speech eval pending neurology consult pending obtain 2-D echocardiogram of the patient is in sinus rhythm monitor be met in a.m.   Consultants: Neurology speech pathology and physical therapy   Procedures   Antibiotics:                   Code Status: No code DO NOT RESUSCITATE   Family Communication:  Spoke with family members yesterday  Disposition Plan see plan above  Time spent: 30 minutes   LOS: 2 days   Leoni Goodness M   08/18/2015, 9:39 AM

## 2015-08-18 NOTE — Consult Note (Signed)
[place] A. Merlene Laughter, MD     [email or web address]          Brooke Simpson is an 81 y.o. female.   ASSESSMENT/PLAN: Acute right MCA infarct due to intracranial stenosis - Risk factors age, hypertension and dyslipidemia. Prognosis guarded given age.   RECOMMENDATION: Dual antiplatelet agent for 3 months and then afterwards single agent. Blood pressure control.  The patient is a 81 year old white female who has had a gradual downturn over the last 3 months. The family reports that 3 months ago she was walking around with a cane but has gradually gotten worse. Her problems gone even worse over the last 2 weeks the patient requires contact assistance but around. She in fact has been in the wheelchair for the past 2 weeks. They report that she had significant osteoarthritis. The patient has been seen in the emergency room because of worsening weakness over the last couple days. Workup has been mostly unrevealing. She had episode of what appears to be syncope, altered mental status and left-sided weakness. Patient has had some difficulty swallowing. The review systems Limited due to the mentation.  GENERAL: Thin pleasant lady who appears 5-10 years and than the stated age.  HEENT: Supple. Atraumatic normocephalic.   ABDOMEN: soft  EXTREMITIES: No edema. There is marked arthritic changes of the knees.  BACK: Normal.  SKIN: Normal by inspection.    MENTAL STATUS: Drooling with eyes closed. She does all her eyes to sternal rub. She does follow commands bilaterally. Speech is moderate to severely dysarthric.   CRANIAL NERVES: Pupils are equal, round and reactive to light and accommodation; extra ocular movements are full, there is no significant nystagmus; visual fields are full; upper and lower facial muscles are normal in strength and symmetric, there is no flattening of the nasolabial folds; tongue is midline.  MOTOR: She has antigravity strength in all 4 extremities.  Exact strength is unclear but at least 3/5 on both sides. Bulk and tone are normal throughout.  COORDINATION: Left finger to nose is normal, right finger to nose is normal, No rest tremor; no intention tremor; no postural tremor; no bradykinesia.  REFLEXES: Deep tendon reflexes are symmetrical and normal. Babinski reflexes are flexor bilaterally.   SENSATION: Normal to pain.   Blood pressure 194/87, pulse 93, temperature 98.6 F (37 C), temperature source Oral, resp. rate 16, height 5' 2" (1.575 m), weight 40.8 kg (89 lb 15.2 oz), SpO2 96 %.  Past Medical History  Diagnosis Date  . Hypertension   . Irregular heartbeat   . Hypothyroid     History reviewed. No pertinent past surgical history.  History reviewed. No pertinent family history.  Social History:  reports that she has never smoked. She does not have any smokeless tobacco history on file. She reports that she does not drink alcohol or use illicit drugs.  Allergies:  Allergies  Allergen Reactions  . Prednisone Other (See Comments)    REACTION: Altered mental status, hyperactivity     Medications: Prior to Admission medications   Medication Sig Start Date End Date Taking? Authorizing Provider  aspirin EC 81 MG tablet Take 81 mg by mouth every morning.   Yes Historical Provider, MD  BYSTOLIC 5 MG tablet Take 5 mg by mouth daily. 07/17/15  Yes Historical Provider, MD  cloNIDine (CATAPRES) 0.1 MG tablet Take 0.1 mg by mouth at bedtime.  07/17/15  Yes Historical Provider, MD  Iron-Vitamins (GERITOL COMPLETE PO) Take 1 tablet by mouth daily.  Yes Historical Provider, MD  levothyroxine (SYNTHROID, LEVOTHROID) 50 MCG tablet Take 50 mcg by mouth daily. 08/05/15  Yes Historical Provider, MD  LORazepam (ATIVAN) 0.5 MG tablet Take 0.25 mg by mouth at bedtime.    Yes Historical Provider, MD  methyldopa (ALDOMET) 250 MG tablet Take 250 mg by mouth 2 (two) times daily. 07/26/15  Yes Historical Provider, MD  polyethylene glycol powder  (GLYCOLAX/MIRALAX) powder Take 17 g by mouth at bedtime.   Yes Historical Provider, MD  senna-docusate (SENNA PLUS) 8.6-50 MG per tablet Take 1 tablet by mouth at bedtime.    Yes Historical Provider, MD  telmisartan (MICARDIS) 40 MG tablet Take 40 mg by mouth every morning.   Yes Historical Provider, MD  verapamil (CALAN-SR) 240 MG CR tablet Take 240 mg by mouth every morning.   Yes Historical Provider, MD  Naphazoline HCl (CLEAR EYES OP) Apply 1 drop to eye daily as needed (for dry eye relief).     Historical Provider, MD    Scheduled Meds: . aspirin  300 mg Rectal Daily   Or  . aspirin  325 mg Oral Daily  . cloNIDine  0.3 mg Transdermal Weekly   Continuous Infusions: . sodium chloride 75 mL/hr at 08/18/15 0329   PRN Meds:.enalaprilat, hydrALAZINE     Results for orders placed or performed during the hospital encounter of 08/16/15 (from the past 48 hour(s))  RPR     Status: None   Collection Time: 08/17/15  3:51 PM  Result Value Ref Range   RPR Ser Ql Non Reactive Non Reactive    Comment: (NOTE) Performed At: [facility] [place], [place] [id number] Lindon Romp MD [id number]   Sedimentation rate     Status: Abnormal   Collection Time: 08/17/15  3:51 PM  Result Value Ref Range   Sed Rate 100 (H) 0 - 22 mm/hr  CBC     Status: Abnormal   Collection Time: 08/17/15  3:51 PM  Result Value Ref Range   WBC 11.7 (H) 4.0 - 10.5 K/uL   RBC 3.09 (L) 3.87 - 5.11 MIL/uL   Hemoglobin 10.0 (L) 12.0 - 15.0 g/dL   HCT 31.3 (L) 36.0 - 46.0 %   MCV 101.3 (H) 78.0 - 100.0 fL   MCH 32.4 26.0 - 34.0 pg   MCHC 31.9 30.0 - 36.0 g/dL   RDW 13.7 11.5 - 15.5 %   Platelets 256 150 - 400 K/uL  TSH     Status: None   Collection Time: 08/17/15  3:51 PM  Result Value Ref Range   TSH 2.351 0.350 - 4.500 uIU/mL  Vitamin B12     Status: Abnormal   Collection Time: 08/17/15  5:48 PM  Result Value Ref Range   Vitamin B-12 1263 (H) 180 - 914 pg/mL    Comment:  (NOTE) This assay is not validated for testing neonatal or myeloproliferative syndrome specimens for Vitamin B12 levels. Performed at [facility]   Folate     Status: None   Collection Time: 08/17/15  5:48 PM  Result Value Ref Range   Folate 46.6 >5.9 ng/mL    Comment: RESULTS CONFIRMED BY MANUAL DILUTION Performed at Early metabolic panel     Status: Abnormal   Collection Time: 08/18/15  6:19 AM  Result Value Ref Range   Sodium 142 135 - 145 mmol/L   Potassium 2.8 (L) 3.5 - 5.1 mmol/L    Comment: DELTA CHECK NOTED RESULT  REPEATED AND VERIFIED    Chloride 111 101 - 111 mmol/L   CO2 20 (L) 22 - 32 mmol/L   Glucose, Bld 83 65 - 99 mg/dL   BUN 20 6 - 20 mg/dL   Creatinine, Ser 0.90 0.44 - 1.00 mg/dL   Calcium 8.3 (L) 8.9 - 10.3 mg/dL   GFR calc non Af Amer 52 (L) >60 mL/min   GFR calc Af Amer 60 (L) >60 mL/min    Comment: (NOTE) The eGFR has been calculated using the CKD EPI equation. This calculation has not been validated in all clinical situations. eGFR's persistently <60 mL/min signify possible Chronic Kidney Disease.    Anion gap 11 5 - 15    Studies/Results: BRAIN MRI 1. Acute nonhemorrhagic infarct involving the right frontal operculum in anterior insular cortex compatible with focal obstruction of the anterior right M2 division seen on the MRA. 2. Extensive atrophy and white matter disease compatible with chronic microvascular disease. 3. Diffuse sinus disease.  BRAIN MRA IMPRESSION: 1. Anterior right MCA M2 branch occlusion about 9 mm beyond the right MCA bifurcation. See MRI from today reported separately. 2. Otherwise negative intracranial MRA.   ECHO - Left ventricle: The cavity size was normal. Wall thickness was increased in a pattern of moderate LVH. Systolic function was normal. The estimated ejection fraction was in the range of 60% to 65%. Doppler parameters are consistent with pseudonormal  left ventricular relaxation (grade 2 diastolic dysfunction). The E/A ratio is >1.5. The E/e&' ratio is >15, suggesting elevated LV filling pressure. - Aortic valve: Sclerosis without stenosis. There was trivial regurgitation. - Mitral valve: Mildly thickened leaflets . There was mild regurgitation. - Left atrium: Moderately dilated at 43 ml/m2. - Right atrium: The atrium was mildly dilated. - Tricuspid valve: There was moderate regurgitation. - Pulmonary arteries: PA peak pressure: 63 mm Hg (S).  CAROTID DOPPLERS fine  Brooke Simpson A. Merlene Laughter, M.D.  Diplomate, Tax adviser of Psychiatry and Neurology ( Neurology). 08/18/2015, 10:51 PM

## 2015-08-19 LAB — BASIC METABOLIC PANEL
Anion gap: 14 (ref 5–15)
BUN: 24 mg/dL — AB (ref 6–20)
CHLORIDE: 113 mmol/L — AB (ref 101–111)
CO2: 15 mmol/L — AB (ref 22–32)
CREATININE: 0.9 mg/dL (ref 0.44–1.00)
Calcium: 8.5 mg/dL — ABNORMAL LOW (ref 8.9–10.3)
GFR calc Af Amer: 60 mL/min — ABNORMAL LOW (ref >60)
GFR calc non Af Amer: 52 mL/min — ABNORMAL LOW (ref >60)
GLUCOSE: 131 mg/dL — AB (ref 65–99)
POTASSIUM: 3.2 mmol/L — AB (ref 3.5–5.1)
Sodium: 142 mmol/L (ref 135–145)

## 2015-08-19 MED ORDER — VERAPAMIL HCL ER 240 MG PO TBCR
240.0000 mg | EXTENDED_RELEASE_TABLET | Freq: Every day | ORAL | Status: DC
  Administered 2015-08-19: 240 mg via ORAL
  Filled 2015-08-19 (×2): qty 1

## 2015-08-19 MED ORDER — POTASSIUM CHLORIDE 10 MEQ/100ML IV SOLN
10.0000 meq | INTRAVENOUS | Status: AC
  Administered 2015-08-19 (×3): 10 meq via INTRAVENOUS
  Filled 2015-08-19: qty 100

## 2015-08-19 MED ORDER — MORPHINE SULFATE (PF) 2 MG/ML IV SOLN
0.5000 mg | Freq: Once | INTRAVENOUS | Status: DC
  Filled 2015-08-19: qty 1

## 2015-08-19 MED ORDER — CLOPIDOGREL BISULFATE 75 MG PO TABS
75.0000 mg | ORAL_TABLET | Freq: Every day | ORAL | Status: DC
  Filled 2015-08-19 (×2): qty 1

## 2015-08-19 NOTE — Progress Notes (Signed)
Appreciate neurology expertise. Plavix added to full dose aspirin. Resume verapamil SR 240 by mouth daily and advance diet as tolerated potassium 3.2 replenished intravenously Brooke Simpson GDJ:[id number] DOB: June 17, 1915 DOA: 08/16/2015 PCP: Brooke Curet, MD             Physical Exam: Blood pressure 185/75, pulse 106, temperature 100.2 F (37.9 C), temperature source Axillary, resp. rate 18, height _0  (1.575 m), weight 89 lb 15.2 oz (40.8 kg), SpO2 93 %. lungs clear to A&P diminished breath sounds to bases no rales wheeze rhonchi heart regular rhythm 1/6 systolic aortic murmur no gallops he feels rubs abdomen soft nontender bowel sounds normoactive no guarding or rebound masses no megaly. Left arm continues to show 2 out of 5 motor strength dysarthria persists swallowing somewhat improved   Investigations:  No results found for this or any previous visit (from the past 240 hour(s)).   Basic Metabolic Panel:  Recent Labs  08/18/15 0619 08/19/15 0606  NA 142 142  K 2.8* 3.2*  CL 111 113*  CO2 20* 15*  GLUCOSE 83 131*  BUN 20 24*  CREATININE 0.90 0.90  CALCIUM 8.3* 8.5*   Liver Function Tests:  Recent Labs  08/16/15 2058  AST 29  ALT 17  ALKPHOS 107  BILITOT 0.4  PROT 6.1*  ALBUMIN 2.7*     CBC:  Recent Labs  08/16/15 2058 08/17/15 1551  WBC 10.3 11.7*  NEUTROABS 7.8*  --   HGB 9.5* 10.0*  HCT 30.6* 31.3*  MCV 105.2* 101.3*  PLT 221 256    Mr Brooke Simpson Head Wo Contrast  08/17/2015  CLINICAL DATA:  81 year old female with left side weakness and confusion. Initial encounter. EXAM: MRA HEAD WITHOUT CONTRAST TECHNIQUE: Angiographic images of the Circle of Willis were obtained using MRA technique without intravenous contrast. COMPARISON:  Brain MRI from 0816 hours today reported separately. Head CT 08/16/2015. FINDINGS: Mildly degraded by motion despite repeated imaging attempts. Antegrade flow in the posterior circulation with codominant appearing distal  vertebral arteries. Patent basilar artery without stenosis. SCA and PCA origins appear patent. Bilateral PCA branches are within normal limits. Posterior communicating arteries are diminutive or absent. Antegrade flow in both ICA siphons. No siphon stenosis. Cavernous and supraclinoid irregularity in keeping with atherosclerosis, greater on the left. Patent carotid termini. Normal ACA and MCA origins. Left MCA M1 segment, bifurcation, and left MCA branches are within normal limits. Anterior communicating artery is diminutive or absent. Bilateral ACA branches are within normal limits. The right MCA origin and M1 segment are patent. The right MCA bifurcation is patent. The dominant anterior M2 division is occluded about 9 mm from its origin. The other visualized right MCA branches are within normal limits. IMPRESSION: 1. Anterior right MCA M2 branch occlusion about 9 mm beyond the right MCA bifurcation. See MRI from today reported separately. 2. Otherwise negative intracranial MRA. Electronically Signed   By: Genevie Ann M.D.   On: 08/17/2015 08:53   Mr Brain Wo Contrast  08/17/2015  CLINICAL DATA:  Left-sided weakness and confusion. EXAM: MRI HEAD WITHOUT CONTRAST TECHNIQUE: Multiplanar, multiecho pulse sequences of the brain and surrounding structures were obtained without intravenous contrast. COMPARISON:  CT head without contrast 08/16/2015. FINDINGS: Acute nonhemorrhagic infarct is evident within the anterior left right MCA distribution. This involves the right frontal operculum and anterior aspect of the insular cortex. The primary motor cortex is spared. The basal ganglia are intact. Atrophy and diffuse chronic white matter change is noted. White matter disease  extends into the brainstem. Flow is present in the major intracranial arteries. The globes and orbits are intact. There is extensive opacification of left anterior ethmoid air cells. The sphenoid sinuses are opacified bilaterally. Scattered right ethmoid  sinus disease is present. The right maxillary sinus spared. The mastoid air cells are clear. IMPRESSION: 1. Acute nonhemorrhagic infarct involving the right frontal operculum in anterior insular cortex compatible with focal obstruction of the anterior right M2 division seen on the MRA. 2. Extensive atrophy and white matter disease compatible with chronic microvascular disease. 3. Diffuse sinus disease. Electronically Signed   By: San Morelle M.D.   On: 08/17/2015 08:56   US Carotid Bilateral  08/18/2015  CLINICAL DATA:  Stroke.  Hypertension. EXAM: BILATERAL CAROTID DUPLEX ULTRASOUND TECHNIQUE: Pearline Cables scale imaging, color Doppler and duplex ultrasound was performed of bilateral carotid and vertebral arteries in the neck. COMPARISON:  None. REVIEW OF SYSTEMS: Quantification of carotid stenosis is based on velocity parameters that correlate the residual internal carotid diameter with NASCET-based stenosis levels, using the diameter of the distal internal carotid lumen as the denominator for stenosis measurement. The following velocity measurements were obtained: PEAK SYSTOLIC/END DIASTOLIC RIGHT ICA:                     103/12cm/sec CCA:                     02/1RZ/NBV SYSTOLIC ICA/CCA RATIO:  1.4 DIASTOLIC ICA/CCA RATIO: 2.1 ECA:                     72cm/sec LEFT ICA:                     96/9cm/sec CCA:                     67/0LI/DCV SYSTOLIC ICA/CCA RATIO:  1.4 DIASTOLIC ICA/CCA RATIO: 1.5 ECA:                     83cm/sec FINDINGS: RIGHT CAROTID ARTERY: Circumferential plaque in the carotid bulb and proximal ICA resulting in at least mild stenosis. Normal waveforms and color Doppler signal. RIGHT VERTEBRAL ARTERY:  Normal flow direction and waveform. LEFT CAROTID ARTERY: Circumferential mild partially calcified plaque in the bulb, extending to proximal internal and external carotid arteries. No high-grade stenosis. Normal waveforms and color Doppler signal. A nonspecific cardiac arrhythmias noted. LEFT  VERTEBRAL ARTERY: Normal flow direction and waveform. IMPRESSION: 1. Bilateral carotid bifurcation and proximal ICA plaque, resulting in less than 50% diameter stenosis. The exam does not exclude plaque ulceration or embolization. Continued surveillance recommended. Electronically Signed   By: Lucrezia Europe M.D.   On: 08/18/2015 08:06      Medications:   Impression:  Principal Problem:   Syncope Active Problems:   Altered mental status   Left-sided weakness   Dehydration   COPD (chronic obstructive pulmonary disease) (HCC)   Chronic respiratory failure with hypoxia (HCC)   Cerebral thrombosis with cerebral infarction   Protein-calorie malnutrition, severe     Plan: Resume verapamil 240 by mouth SR daily KCl 10 mEq IV 3 be met in a.m. Plavix 75 mg by mouth daily added monitor hemodynamics physical and speech therapy as ordered  Consultants: Neurology and speech therapy   Procedures   Antibiotics:                   Code Status: DO NOT RESUSCITATE  Family Communication:    Disposition Plan see plan above  Time spent: 30 minutes   LOS: 3 days   Tashaun Obey M   08/19/2015, 8:25 AM

## 2015-08-19 NOTE — Progress Notes (Signed)
Physical Therapy Treatment Patient Details Name: Brooke Simpson MRN: [id number] DOB: [birth year] Today's Date: 08/19/2015    History of Present Illness      PT Comments    Pt holding Lt UE upon entrance to room, stated pain scale 7-8/10 near IV.  RN informed of pain.  Session focus on bed mobility.  Reviewed importance of completing rolling frequently through the day to reduce risk of pressure sore and discussed techniques with ability to verbalize correct techniques.  Pt with improved speech today, dyspnea continues though able to understand all but approximately 30%, family members did assist in understanding as well.  Sitting balance activities complete this session, mod assistance required for supine to sit.  Pt able to independently sit with Bil UE HHA with min assistance to reduce right side leans.  End of session pt left supine in bed, positioned for comfort with support under BLE for heel pressure relief.  Pt stated pain reduced at end of session, was also limited by fatigue with activities.    Follow Up Recommendations        Equipment Recommendations       Recommendations for Other Services       Precautions / Restrictions Precautions Precautions: None    Mobility  Bed Mobility Overal bed mobility: Needs Assistance Bed Mobility: Rolling;Supine to Sit Rolling: Mod assist   Supine to sit: Mod assist     General bed mobility comments: Mod assistance required wtih supine to sit; Sitting balance activites complete with ability to hold self up with BUE support  Transfers                    Ambulation/Gait                 Stairs            Wheelchair Mobility    Modified Rankin (Stroke Patients Only)       Balance Overall balance assessment: Modified Independent Sitting-balance support: Bilateral upper extremity supported     Postural control: Right lateral lean (Hyperkyphatic posture)                          Cognition  Arousal/Alertness: Awake/alert Behavior During Therapy: WFL for tasks assessed/performed Overall Cognitive Status: Difficult to assess                      Exercises      General Comments        Pertinent Vitals/Pain Pain Assessment: 0-10 Pain Score: 8  Pain Location: Lt arm where IV placed, RN informed Pain Intervention(s): Monitored during session;Repositioned;Other (comment) (RN informed)    Home Living                      Prior Function            PT Goals (current goals can now be found in the care plan section) Progress towards PT goals: Progressing toward goals    Frequency       PT Plan Current plan remains appropriate    Co-evaluation             End of Session Equipment Utilized During Treatment: Gait belt Activity Tolerance: Patient tolerated treatment well;Patient limited by fatigue;Patient limited by pain Patient left: in bed;with call bell/phone within reach;with bed alarm set;with nursing/sitter in room;with family/visitor present     Time: 1300-1325 PT Time Calculation (min) (ACUTE ONLY):  25 min  Charges:  $Therapeutic Activity: 23-37 mins                    G Codes:      Juel Burrow 08/19/2015, 1:27 PM

## 2015-08-20 ENCOUNTER — Inpatient Hospital Stay (HOSPITAL_COMMUNITY): Payer: Medicare Other

## 2015-08-20 LAB — BASIC METABOLIC PANEL
Anion gap: 10 (ref 5–15)
BUN: 33 mg/dL — ABNORMAL HIGH (ref 6–20)
CO2: 18 mmol/L — ABNORMAL LOW (ref 22–32)
Calcium: 8.5 mg/dL — ABNORMAL LOW (ref 8.9–10.3)
Chloride: 118 mmol/L — ABNORMAL HIGH (ref 101–111)
Creatinine, Ser: 1.24 mg/dL — ABNORMAL HIGH (ref 0.44–1.00)
GFR calc Af Amer: 41 mL/min — ABNORMAL LOW (ref >60)
GFR calc non Af Amer: 35 mL/min — ABNORMAL LOW (ref >60)
Glucose, Bld: 143 mg/dL — ABNORMAL HIGH (ref 65–99)
Potassium: 3.3 mmol/L — ABNORMAL LOW (ref 3.5–5.1)
Sodium: 146 mmol/L — ABNORMAL HIGH (ref 135–145)

## 2015-08-20 MED ORDER — LORAZEPAM 2 MG/ML IJ SOLN
0.2500 mg | INTRAMUSCULAR | Status: DC | PRN
  Administered 2015-08-20 – 2015-08-21 (×3): 0.25 mg via INTRAVENOUS
  Filled 2015-08-20 (×4): qty 1

## 2015-08-20 MED ORDER — AMLODIPINE BESYLATE 5 MG PO TABS
5.0000 mg | ORAL_TABLET | Freq: Every day | ORAL | Status: DC
  Filled 2015-08-20 (×2): qty 1

## 2015-08-20 MED ORDER — NEBIVOLOL HCL 10 MG PO TABS
5.0000 mg | ORAL_TABLET | Freq: Every day | ORAL | Status: DC
  Filled 2015-08-20: qty 1

## 2015-08-20 MED ORDER — METOPROLOL TARTRATE 1 MG/ML IV SOLN
5.0000 mg | Freq: Four times a day (QID) | INTRAVENOUS | Status: DC | PRN
  Administered 2015-08-20: 5 mg via INTRAVENOUS
  Filled 2015-08-20 (×2): qty 5

## 2015-08-20 MED ORDER — LORAZEPAM 2 MG/ML IJ SOLN
0.2500 mg | Freq: Once | INTRAMUSCULAR | Status: AC
  Administered 2015-08-20: 0.25 mg via INTRAVENOUS
  Filled 2015-08-20: qty 1

## 2015-08-20 MED ORDER — SODIUM CHLORIDE 0.9 % IV BOLUS (SEPSIS)
500.0000 mL | Freq: Once | INTRAVENOUS | Status: AC
  Administered 2015-08-20: 500 mL via INTRAVENOUS

## 2015-08-20 MED ORDER — METOPROLOL TARTRATE 25 MG PO TABS
25.0000 mg | ORAL_TABLET | Freq: Two times a day (BID) | ORAL | Status: DC
  Filled 2015-08-20: qty 1

## 2015-08-20 MED ORDER — METOPROLOL TARTRATE 1 MG/ML IV SOLN
5.0000 mg | Freq: Once | INTRAVENOUS | Status: AC
  Administered 2015-08-20: 5 mg via INTRAVENOUS

## 2015-08-20 NOTE — Progress Notes (Signed)
Patient family stated that patient was experiencing generalized pain. MD was notified, one time order for low dose IV pain medication was ordered. Once entering the room to assess pain, patient stated " no" when asked if she was in any pain. Pain medication was not given. Patient family also requested to swab patient's mouth with coke. Family educated on patient diet and honey nectar thick liquids, patient's family verbalized understanding. Will continue to monitor patient status.

## 2015-08-20 NOTE — Progress Notes (Signed)
Per pharmacy, Bystolic and Verapamil cannot be crushed.  Dr. Janna ArchonDiego notified and gave order for to d/c Bystolic and Verapamil and ordered Lopressor 25 mg po twice a day and Norvasc 5 mg once a day.

## 2015-08-20 NOTE — Progress Notes (Signed)
Dr. Janna ArchonDiego notified of patient's VS see MAR.  Dr. Janna ArchonDiego aware that patient unable to take oral medications.  Aware of VTach (12 beats per telemetry).  Dr. Janna ArchonDiego notified that Lopressor had been given IV this morning.  Dr. Janna ArchonDiego asked what IV medications for blood pressure have been given.  Vasotec IV ordered parameter for greater than 160 systolic - Dr. Janna ArchonDiego aware and stated to hold this.  Dr. Janna ArchonDiego also stated to hold the IV Hydralazine because this will make the patient's heart rate increase even more.  Continue every 4 hour vital signs.

## 2015-08-20 NOTE — Progress Notes (Signed)
Hospice nurse into speak with the pt's nephew, Link Snufferddie to discuss hospice services as GIP, hospice home admission and home hospice.  Pt's nephew wants the pt to remain under comfort care at the hospital for 24 hrs and then admit pt to hospice services under GIP care at [facility].  If the pt survives then he will decide whether he wants the pt to go to the hospice home or home with hospice.

## 2015-08-20 NOTE — Care Management Note (Signed)
Case Management Note  Patient Details  Name: Abigail Buttslsie C Buttery MRN: [id number] Date of Birth: 03-Jul-1915  Subjective/Objective:                    Action/Plan:   Expected Discharge Date:                  Expected Discharge Plan:  Home/Self Care  In-House Referral:  NA  Discharge planning Services  CM Consult  Post Acute Care Choice:  Durable Medical Equipment Choice offered to:  Patient  DME Arranged:  Hospital bed DME Agency:  [facility]  [facility]:     Status of Service:  In process, will continue to follow  Medicare Important Message Given:  Yes-second notification given Date Medicare IM Given:    Medicare IM give by:    Date Additional Medicare IM Given:    Additional Medicare Important Message give by:     If discussed at Long Length of Stay Meetings, dates discussed:    Additional Comments: Pt with elevated HR, unable to take po at this time. MD along with PCP discussed options with pts HCPOA. Hospice was discussed and family would like hospice consult to discuss options. Also comfort care was discussed. Hospice to meet with family today. Will continue to follow for discharge planning needs. Arlyss QueenBlackwell, Ayah Cozzolino Taylorsvillerowder, RN 08/20/2015, 4:34 PM

## 2015-08-20 NOTE — Progress Notes (Signed)
Lengthy discussion with double power of attorney is a grandson in believe Discussed Her Abysmal Prognosis Deterioration Clinically and Her Mental Acuity Decline until She Is Impending Decline Long Discussion about Palliative Care Comfort Care and Hospice Care If Elected to Present to Hospice Care and Discontinue Telemetry and All Antihypertensive Medicines Her Mouth for but Warm and Moist with a Bulbous Syringe Full of H2O This Is an Accordance with the Wishes of the Patient Herself Lab Known for 8-10 Years Brooke Simpson ZOX:[id number] DOB: 08-Apr-1915 DOA: 08/16/2015 PCP: Brooke StallingNDIEGO,Brooke Mcadoo M, MD             Physical Exam: Blood pressure 141/86, pulse 149, temperature 100 F (37.8 C), temperature source Axillary, resp. rate 46, height 5\' 2"  (1.575 Simpson), weight 89 lb 15.2 oz (40.8 kg), SpO2 94 %. lungs clear to A&P no rales wheeze rhonchi heart regular rhythm no murmurs goes heaves feels rubs abdomen soft nontender bowel sounds normoactive patient response to tactile stimuli not to verbal stimuli left arm does not squeeze right arm she moves purposelessly throat appears dry patient has been given 500 mL IV fluid bolus of normal saline   Investigations:  No results found for this or any previous visit (from the past 240 hour(s)).   Basic Metabolic Panel:  Recent Labs  81/19/1410/30/16 0606 08/20/15 0901  NA 142 146*  K 3.2* 3.3*  CL 113* 118*  CO2 15* 18*  GLUCOSE 131* 143*  BUN 24* 33*  CREATININE 0.90 1.24*  CALCIUM 8.5* 8.5*   Liver Function Tests: No results for input(s): AST, ALT, ALKPHOS, BILITOT, PROT, ALBUMIN in the last 72 hours.   CBC:  Recent Labs  08/17/15 1551  WBC 11.7*  HGB 10.0*  HCT 31.3*  MCV 101.3*  PLT 256    No results found.    Medications:   Impression: Abysmal prognosis  Principal Problem:   Syncope Active Problems:   Altered mental status   Left-sided weakness   Dehydration   COPD (chronic obstructive pulmonary disease) (HCC)   Chronic  respiratory failure with hypoxia (HCC)   Cerebral thrombosis with cerebral infarction   Protein-calorie malnutrition, severe     Plan: Comfort care only keep mouth moist with bulb syringe of fluid every 30 minutes to one H DC telemetry DC antihypertensive medicines intravenously hospice consult summoned patient is DO NOT RESUSCITATE   Consultants:    Procedure   Antibiotics:                   Code Status: DO NOT RESUSCITATE  Family Communication: Lengthy discussion with son and grandson who are POA   Disposition Plan see plan above  Time spent: 40 minutes   LOS: 4 days   Joeline Freer Simpson   08/20/2015, 1:19 PM

## 2015-08-20 NOTE — Progress Notes (Signed)
Speech Language Pathology Treatment:    Patient Details Name: Abigail Buttslsie C Yokum MRN: [id number] DOB: [birth year] Today's Date: 08/20/2015 Time: [id number] SLP Time Calculation (min) (ACUTE ONLY): 17 min  Assessment / Plan / Recommendation Clinical Impression  SLP provided oral care; pt presents with dry copious secretions on lingual surface and on her teeth cleared as permitted by pt with toothettes. Pt is very lethargic. Family and nursing report pt is not accepting PO trials of Puree or NTL. Pt remains on least restrictive diet and comfort measures and hospice consult were both initiated this date. SLP provided education to family and nursing of oral care and aspiration precautions. No further ST needs present at this time.    HPI Other Pertinent Information: Ms. Yetta BarreJones is a 81 yo female h/o HTN lives at home but has 24 hour care arranged by family brought in tonight for progressive worsening po intake, weakness for the past week.she has been in the ED and seen her PCP dr Janna Archdondiego in the last several days for generalized weakness. 2 days ago in the ED, her work up was unrevealing. At her baseline she walks around well, mentates normally per family. Then 10/27 she all of a sudden became unresponsive, slumped over. 911 was called. When she awoke, she was having left side hemiparesis. MRI showed CVA (Acute nonhemorrhagic infarct involving the right frontal operculum in anterior insular cortex compatible with focal obstruction of the anterior right M2 division seen on the MRA).   Pertinent Vitals    SLP Plan       Recommendations Diet recommendations: Dysphagia 1 (puree);Nectar-thick liquid Medication Administration: Crushed with puree Compensations: Slow rate;Small sips/bites;Clear throat intermittently;Multiple dry swallows after each bite/sip;Follow solids with liquid;Minimize environmental distractions              Oral Care Recommendations: Oral care QID   Dwyane Dupree H. Romie LeveeYarbrough MA,  CCC-SLP Speech Language Pathologist       Georgetta Habermelia H Encarnacion Scioneaux 08/20/2015, 3:07 PM

## 2015-08-20 NOTE — Progress Notes (Signed)
PT Cancellation Note  Patient Details Name: Abigail Buttslsie C Madruga MRN: [id number] DOB: 01/30/1915   Cancelled Treatment:    Reason Eval/Treat Not Completed: Other (comment).  Pt is being transitioned to comfort care only and a referral for Hospice is being made.  PT will be d/c'd.   Myrlene BrokerBrown, Marshea Wisher L  PT 08/20/2015, 1:19 PM [phone]

## 2015-08-20 NOTE — Progress Notes (Signed)
[id number] - Dr. Janna ArchonDiego notified that patient's family reports unable to swallow anything.  Notified of VS and VTach. If unable to swallow give Lopressor 5 mg IV every 6 hours as needed for systolic greater than 160.

## 2015-08-20 NOTE — Progress Notes (Signed)
Patient now comfort measure.  Dr. Janna ArchonDiego gave order to discontinue vital signs, neuro checks and telemetry monitoring.  Stated to continue IV fluids and will reevaluate tomorrow.

## 2015-08-20 NOTE — Progress Notes (Signed)
RN notified of Dr. Janna ArchonDiego of patient's VS, O2 2L [place] now on patient.  Dr. Janna ArchonDiego gave order for patient to receive NS 500 bolus.  Stated he will be "in to see the patient very shortly."

## 2015-08-20 NOTE — Progress Notes (Signed)
Dr. Janna ArchonDiego notified of patient's blood pressure.  Dr. Janna ArchonDiego ordered Bystolic 5 mg now and daily.  Family requesting lab be drawn later this morning (for BMET).  Dr. Janna ArchonDiego stated BMET may be drawn at 0900.  Lab notified.

## 2015-08-20 NOTE — Progress Notes (Addendum)
PT Cancellation Note  Patient Details Name: Brooke Simpson MRN: [id number] DOB: [birth year]   Cancelled Treatment:    Reason Eval/Treat Not Completed: Medical issues which prohibited therapy.  Pt's BP is extremely elevated and HR into the 140s at rest.  We will hold PT.   [name], [name]  PT 08/20/2015, 9:39 AM [phone]

## 2015-08-20 NOTE — Progress Notes (Addendum)
Patient's BP and heart rate continued to be elevated.  Dr. Janna ArchonDiego notified and gave order to receive Lopressor 5 mg IV again once since unable to take po medications.

## 2015-08-21 ENCOUNTER — Inpatient Hospital Stay (HOSPITAL_COMMUNITY)
Admission: RE | Admit: 2015-08-21 | Discharge: 2015-09-20 | DRG: 064 | Disposition: E | Source: Hospice | Attending: Family Medicine | Admitting: Family Medicine

## 2015-08-21 DIAGNOSIS — J9611 Chronic respiratory failure with hypoxia: Secondary | ICD-10-CM | POA: Diagnosis present

## 2015-08-21 DIAGNOSIS — J449 Chronic obstructive pulmonary disease, unspecified: Secondary | ICD-10-CM | POA: Diagnosis present

## 2015-08-21 DIAGNOSIS — Z515 Encounter for palliative care: Secondary | ICD-10-CM | POA: Diagnosis present

## 2015-08-21 DIAGNOSIS — G8194 Hemiplegia, unspecified affecting left nondominant side: Secondary | ICD-10-CM | POA: Diagnosis present

## 2015-08-21 DIAGNOSIS — E860 Dehydration: Secondary | ICD-10-CM | POA: Diagnosis present

## 2015-08-21 DIAGNOSIS — I639 Cerebral infarction, unspecified: Principal | ICD-10-CM | POA: Diagnosis present

## 2015-08-21 DIAGNOSIS — I10 Essential (primary) hypertension: Secondary | ICD-10-CM | POA: Diagnosis present

## 2015-08-21 DIAGNOSIS — E039 Hypothyroidism, unspecified: Secondary | ICD-10-CM | POA: Diagnosis present

## 2015-08-21 DIAGNOSIS — E43 Unspecified severe protein-calorie malnutrition: Secondary | ICD-10-CM | POA: Diagnosis present

## 2015-08-21 DIAGNOSIS — Z66 Do not resuscitate: Secondary | ICD-10-CM | POA: Diagnosis present

## 2015-08-21 MED ORDER — LORAZEPAM 2 MG/ML IJ SOLN
0.2500 mg | INTRAMUSCULAR | Status: DC | PRN
  Administered 2015-08-21 (×2): 0.25 mg via INTRAVENOUS
  Filled 2015-08-21 (×2): qty 1

## 2015-09-20 NOTE — Progress Notes (Signed)
Brooke Arenas, RN and I admitted pt to Hospice services. We met with pt's great great nephew Brooke Simpson to complete IA. Brooke Simpson is the Customer service manager. Pt admitted to [facility] last week due to a stroke. Pt has been unresponsive the past three days. Brooke Simpson verbalized before pt became unresponsive pt told him she is at peace and ready to die. Brooke Simpson is coping well knowing pt is at peace. He verbalized doctor told him pt will probably not live past tonight. Brooke Simpson lives out of town, but will remain in the area until pt dies. Plan to visit tomorrow to provide emotional support/reassurance and assess coping. Nephew verbalized appreciation for Hospice services.

## 2015-09-20 NOTE — Progress Notes (Signed)
Hospice nurse and CPoteat, BSW met with the pt's nephew, Eddie(POA) to review hospice services and philosophy.  HMB was elected and consent forms were signed.  Copies of all signed forms were left with PCG. SN will visit daily, SW and chaplain will visit M-F. And volunteer coordinator will assess pt's needs.  HA services will not be utilized.  Careplans includes: imminent death.  Found pt lying in the hospital bed.  Unresponsive.  HR irregular.  Mottling noted to legs and feet.  Labored respirations.  O2 in use.  No po intake in 3-4 days.  Family unsure of last BM.  Pt is bedbound.  No outward signs of pain noted as evidenced by relaxed facial expressions.  Family seems to think that the pt is comfortable.  On-call magnet given and reviewed with the family.  Instructed them to notify [facility] for any concerns or questions.  On-call sticker placed on the front of the chart.  Instructed [facility] staff to notify [facility] for any concerns or questions.  DNR in place.  SN spoke with TBlackwell, RN, BSN to report that the pt had been admitted to hospice services.

## 2015-09-20 NOTE — Discharge Summary (Signed)
Physician Discharge Summary  Brooke Simpson:[id number] DOB: 10/15/1915 DOA: 08/16/2015  PCP: Isabella Stalling, MD  Admit date: 08/16/2015 Discharge date: 22-Aug-2015   Recommendations for Outpatient Follow-up:  Patient is discharged to general inpatient care and hospice consult requested that will come later this afternoon she is a DO NOT RESUSCITATE we are respecting the patient and family's wishes to allow the patient expired with comfort care measures only due to abysmal prognosis from right middle cerebral artery CVA Discharge Diagnoses:  Principal Problem:   Syncope Active Problems:   Altered mental status   Left-sided weakness   Dehydration   COPD (chronic obstructive pulmonary disease) (HCC)   Chronic respiratory failure with hypoxia (HCC)   Cerebral thrombosis with cerebral infarction   Protein-calorie malnutrition, severe   Discharge Condition: Expectant 2 expire  [facility]   08/16/15 2022 08/16/15 2336  Weight: 96 lb 2 oz (43.602 kg) 89 lb 15.2 oz (40.8 kg)    History of present illness:  Patient is a pleasant 81 year old white female with history of severe hypertension degenerative joint disease osteoporosis history of COPD who had a right middle cerebral artery of CVA nonhemorrhagic with associated left-sided weakness she had altered mental status for 24 hour. She was quite lucent despite (3 para status she became obtunded 3 days ago and does not respond only to tactile painful stimuli she cannot follow commands given her advanced age and previous advanced directives in my office her prognosis is abysmal and she is discharged from this hospital now to undergo general inpatient care with a hospice consult as per patient and family's desires and I am in agreement with this  Hospital Course:  See history of present illness  Procedures:     Consultations:   Discharge Instructions     Medication List    ASK your doctor about these medications      aspirin EC 81 MG tablet  Take 81 mg by mouth every morning.     BYSTOLIC 5 MG tablet  Generic drug:  nebivolol  Take 5 mg by mouth daily.     CLEAR EYES OP  Apply 1 drop to eye daily as needed (for dry eye relief).     cloNIDine 0.1 MG tablet  Commonly known as:  CATAPRES  Take 0.1 mg by mouth at bedtime.     GERITOL COMPLETE PO  Take 1 tablet by mouth daily.     levothyroxine 50 MCG tablet  Commonly known as:  SYNTHROID, LEVOTHROID  Take 50 mcg by mouth daily.     LORazepam 0.5 MG tablet  Commonly known as:  ATIVAN  Take 0.25 mg by mouth at bedtime.     methyldopa 250 MG tablet  Commonly known as:  ALDOMET  Take 250 mg by mouth 2 (two) times daily.     polyethylene glycol powder powder  Commonly known as:  GLYCOLAX/MIRALAX  Take 17 g by mouth at bedtime.     SENNA PLUS 8.6-50 MG tablet  Generic drug:  senna-docusate  Take 1 tablet by mouth at bedtime.     telmisartan 40 MG tablet  Commonly known as:  MICARDIS  Take 40 mg by mouth every morning.     verapamil 240 MG CR tablet  Commonly known as:  CALAN-SR  Take 240 mg by mouth every morning.       Allergies  Allergen Reactions  . Prednisone Other (See Comments)    REACTION: Altered mental status, hyperactivity  The results of significant diagnostics from this hospitalization (including imaging, microbiology, ancillary and laboratory) are listed below for reference.    Significant Diagnostic Studies: Ct Head Wo Contrast  08/16/2015  CLINICAL DATA:  Altered level of consciousness and sudden onset of left-sided weakness. EXAM: CT HEAD WITHOUT CONTRAST TECHNIQUE: Contiguous axial images were obtained from the base of the skull through the vertex without contrast. COMPARISON:  05/21/2012 FINDINGS: Age appropriate atrophy with mild dilatation of the lateral ventricles. Again noted is diffuse low density throughout the periventricular white matter which is unchanged. No evidence for acute hemorrhage, mass  lesion, midline shift, hydrocephalus or large infarct. Patient has extensive paranasal sinus disease. Near complete opacification of the sphenoid sinuses. Complete opacification in the left maxillary sinus. Extensive opacification in the bilateral ethmoid air cells, left side greater than right. Complete opacification of the left frontal sinus. Mastoid air cells are clear. No acute bone abnormality. IMPRESSION: No acute intracranial abnormality. Stable atrophy with white matter changes. White matter changes may represent chronic small vessel ischemic disease. Extensive paranasal sinus disease as described. These results were called by telephone at the time of interpretation on 08/16/2015 at 9:07 pm to Dr. Linwood DibblesJON KNAPP , who verbally acknowledged these results. Electronically Signed   By: Richarda OverlieAdam  Henn M.D.   On: 08/16/2015 21:07   Mr Maxine GlennMra Head Wo Contrast  08/17/2015  CLINICAL DATA:  81 year old female with left side weakness and confusion. Initial encounter. EXAM: MRA HEAD WITHOUT CONTRAST TECHNIQUE: Angiographic images of the Circle of Willis were obtained using MRA technique without intravenous contrast. COMPARISON:  Brain MRI from 0816 hours today reported separately. Head CT 08/16/2015. FINDINGS: Mildly degraded by motion despite repeated imaging attempts. Antegrade flow in the posterior circulation with codominant appearing distal vertebral arteries. Patent basilar artery without stenosis. SCA and PCA origins appear patent. Bilateral PCA branches are within normal limits. Posterior communicating arteries are diminutive or absent. Antegrade flow in both ICA siphons. No siphon stenosis. Cavernous and supraclinoid irregularity in keeping with atherosclerosis, greater on the left. Patent carotid termini. Normal ACA and MCA origins. Left MCA M1 segment, bifurcation, and left MCA branches are within normal limits. Anterior communicating artery is diminutive or absent. Bilateral ACA branches are within normal limits.  The right MCA origin and M1 segment are patent. The right MCA bifurcation is patent. The dominant anterior M2 division is occluded about 9 mm from its origin. The other visualized right MCA branches are within normal limits. IMPRESSION: 1. Anterior right MCA M2 branch occlusion about 9 mm beyond the right MCA bifurcation. See MRI from today reported separately. 2. Otherwise negative intracranial MRA. Electronically Signed   By: Odessa FlemingH  Hall M.D.   On: 08/17/2015 08:53   Mr Brain Wo Contrast  08/17/2015  CLINICAL DATA:  Left-sided weakness and confusion. EXAM: MRI HEAD WITHOUT CONTRAST TECHNIQUE: Multiplanar, multiecho pulse sequences of the brain and surrounding structures were obtained without intravenous contrast. COMPARISON:  CT head without contrast 08/16/2015. FINDINGS: Acute nonhemorrhagic infarct is evident within the anterior left right MCA distribution. This involves the right frontal operculum and anterior aspect of the insular cortex. The primary motor cortex is spared. The basal ganglia are intact. Atrophy and diffuse chronic white matter change is noted. White matter disease extends into the brainstem. Flow is present in the major intracranial arteries. The globes and orbits are intact. There is extensive opacification of left anterior ethmoid air cells. The sphenoid sinuses are opacified bilaterally. Scattered right ethmoid sinus disease is present. The right  maxillary sinus spared. The mastoid air cells are clear. IMPRESSION: 1. Acute nonhemorrhagic infarct involving the right frontal operculum in anterior insular cortex compatible with focal obstruction of the anterior right M2 division seen on the MRA. 2. Extensive atrophy and white matter disease compatible with chronic microvascular disease. 3. Diffuse sinus disease. Electronically Signed   By: Marin Roberts M.D.   On: 08/17/2015 08:56   US Carotid Bilateral  08/18/2015  CLINICAL DATA:  Stroke.  Hypertension. EXAM: BILATERAL CAROTID  DUPLEX ULTRASOUND TECHNIQUE: Wallace Cullens scale imaging, color Doppler and duplex ultrasound was performed of bilateral carotid and vertebral arteries in the neck. COMPARISON:  None. REVIEW OF SYSTEMS: Quantification of carotid stenosis is based on velocity parameters that correlate the residual internal carotid diameter with NASCET-based stenosis levels, using the diameter of the distal internal carotid lumen as the denominator for stenosis measurement. The following velocity measurements were obtained: PEAK SYSTOLIC/END DIASTOLIC RIGHT ICA:                     103/12cm/sec CCA:                     75/6cm/sec SYSTOLIC ICA/CCA RATIO:  1.4 DIASTOLIC ICA/CCA RATIO: 2.1 ECA:                     72cm/sec LEFT ICA:                     96/9cm/sec CCA:                     68/6cm/sec SYSTOLIC ICA/CCA RATIO:  1.4 DIASTOLIC ICA/CCA RATIO: 1.5 ECA:                     83cm/sec FINDINGS: RIGHT CAROTID ARTERY: Circumferential plaque in the carotid bulb and proximal ICA resulting in at least mild stenosis. Normal waveforms and color Doppler signal. RIGHT VERTEBRAL ARTERY:  Normal flow direction and waveform. LEFT CAROTID ARTERY: Circumferential mild partially calcified plaque in the bulb, extending to proximal internal and external carotid arteries. No high-grade stenosis. Normal waveforms and color Doppler signal. A nonspecific cardiac arrhythmias noted. LEFT VERTEBRAL ARTERY: Normal flow direction and waveform. IMPRESSION: 1. Bilateral carotid bifurcation and proximal ICA plaque, resulting in less than 50% diameter stenosis. The exam does not exclude plaque ulceration or embolization. Continued surveillance recommended. Electronically Signed   By: Corlis Leak M.D.   On: 08/18/2015 08:06   Dg Chest Portable 1 View  08/16/2015  CLINICAL DATA:  Syncope. Drowsiness and disorientation, acute onset. Initial encounter. EXAM: PORTABLE CHEST 1 VIEW COMPARISON:  Chest radiograph from 08/14/2015 FINDINGS: The lungs are hyperexpanded, with  flattening of the hemidiaphragms, compatible with COPD. A small left pleural effusion is noted, with left basilar airspace opacity. There is no evidence of pneumothorax. The cardiomediastinal silhouette is borderline normal in size. No acute osseous abnormalities are seen. IMPRESSION: Small left pleural effusion, with left basilar airspace opacity. This could reflect pneumonia. Underlying findings of COPD. Electronically Signed   By: Roanna Raider M.D.   On: 08/16/2015 22:27   Dg Chest Port 1 View  08/14/2015  CLINICAL DATA:  Generalized weakness and abdominal pain, history of dementia. Headaches for past week. EXAM: PORTABLE CHEST 1 VIEW COMPARISON:  None. FINDINGS: There is mild cardiomegaly. Atherosclerotic changes noted along the walls of the grossly normal- caliber aortic arch. Pulmonary vasculature is within normal limits. Lungs appear hyperexpanded suggesting COPD. Suspect mild  chronic scarring/fibrosis within each lung, upper lobe predominant. No confluent airspace opacity to suggest a developing pneumonia. No pleural effusions seen. No pneumothorax. Osseous structures are unremarkable. IMPRESSION: 1. Hyperexpanded lungs suggesting COPD. 2. Mild cardiomegaly. 3. No evidence of acute cardiopulmonary abnormality. Electronically Signed   By: Bary Laiklynn Raczynski M.D.   On: 08/14/2015 16:42    Microbiology: No results found for this or any previous visit (from the past 240 hour(s)).   Labs: Basic Metabolic Panel:  Recent Labs Lab 08/14/15 1620 08/16/15 2058 08/18/15 0619 08/19/15 0606 08/20/15 0901  NA 136 139 142 142 146*  K 3.8 4.2 2.8* 3.2* 3.3*  CL 103 110 111 113* 118*  CO2 23 24 20* 15* 18*  GLUCOSE 145* 141* 83 131* 143*  BUN 24* 34* 20 24* 33*  CREATININE 1.05* 1.40* 0.90 0.90 1.24*  CALCIUM 8.8* 8.5* 8.3* 8.5* 8.5*   Liver Function Tests:  Recent Labs Lab 08/14/15 1620 08/16/15 2058  AST 23 29  ALT 19 17  ALKPHOS 104 107  BILITOT 1.1 0.4  PROT 6.6 6.1*  ALBUMIN 3.2*  2.7*   No results for input(s): LIPASE, AMYLASE in the last 168 hours. No results for input(s): AMMONIA in the last 168 hours. CBC:  Recent Labs Lab 08/14/15 1620 08/16/15 2058 08/17/15 1551  WBC 15.5* 10.3 11.7*  NEUTROABS 12.2* 7.8*  --   HGB 10.6* 9.5* 10.0*  HCT 33.0* 30.6* 31.3*  MCV 101.5* 105.2* 101.3*  PLT 268 221 256   Cardiac Enzymes:  Recent Labs Lab 08/14/15 1620 08/16/15 2058  TROPONINI <0.03 <0.03   BNP: BNP (last 3 results)  Recent Labs  08/14/15 1620  BNP 986.0*    ProBNP (last 3 results) No results for input(s): PROBNP in the last 8760 hours.  CBG:  Recent Labs Lab 08/16/15 2118  GLUCAP 126*       Signed:  Nussen Pullin M  [facility] Pager: [phone] September 08, 2015, 11:27 AM

## 2015-09-20 NOTE — Progress Notes (Signed)
Mrs. Brooke Simpson had palliative consult. She has been made GIP status.

## 2015-09-20 NOTE — Care Management Note (Signed)
Case Management Note  Patient Details  Name: Brooke Simpson MRN: [id number] Date of Birth: 03-09-1915  Subjective/Objective:                    Action/Plan:   Expected Discharge Date:                  Expected Discharge Plan:  Hospice Medical Facility  In-House Referral:  NA  Discharge planning Services  CM Consult  Post Acute Care Choice:  [facility]  DME Arranged:    DME Agency:     HH Arranged:    HH Agency:     Status of Service:  Completed, signed off  Medicare Important Message Given:  Yes-second notification given Date Medicare IM Given:    Medicare IM give by:    Date Additional Medicare IM Given:    Additional Medicare Important Message give by:     If discussed at Long Length of Stay Meetings, dates discussed:    Additional Comments: [facility] Pt to be made GIP today. [facility] to admit pt today at 2 pm. Cheryl FlashBlackwell, Brooke Giles Crowder, RN 08/24/2015, 1:04 PM

## 2015-09-20 NOTE — H&P (Deleted)
  The note originally documented on this encounter has been moved the the encounter in which it belongs.  

## 2015-09-20 NOTE — Care Management Note (Signed)
Case Management Note  Patient Details  Name: Brooke Simpson MRN: [id number] Date of Birth: [birth year]  Subjective/Objective:                    Action/Plan:   Expected Discharge Date:                  Expected Discharge Plan:  Hospice Medical Facility  In-House Referral:  NA  Discharge planning Services  CM Consult  Post Acute Care Choice:  [facility]  DME Arranged:    DME Agency:     HH Arranged:    HH Agency:     Status of Service:  Completed, signed off  Medicare Important Message Given:  Yes-second notification given Date Medicare IM Given:    Medicare IM give by:    Date Additional Medicare IM Given:    Additional Medicare Important Message give by:     If discussed at Long Length of Stay Meetings, dates discussed:  09/09/2015  Additional Comments:  Cheryl FlashBlackwell, Tereso Unangst Crowder, RN 09/02/2015, 3:12 PM

## 2015-09-20 NOTE — H&P (Signed)
Brooke Simpson MRN: [id number] DOB/AGE: 04-03-1915 81 y.o. Primary Care Physician:Payten Hobin M, MD Admit date: 08/16/2015 Chief Complaint: Patient admitted to hospice general inpatient care HPI: This is a 81 year old woman who has right middle cerebral artery CVA 4-5 days ago and had 20 preoperative somewhat lucency was left to be parous is seen in consultation by neurology and she was unable to eat became stuporous now S responds only to tactile and painful stimuli for last 3 days in accordance with the family's wishes as well as the patient's long ago in my office will consult hospice for expectant expiration  Past Medical History  Diagnosis Date  . Hypertension   . Irregular heartbeat   . Hypothyroid    Right middle cerebral artery CVA     History reviewed. No pertinent family history.  Social History:  reports that she has never smoked. She does not have any smokeless tobacco history on file. She reports that she does not drink alcohol or use illicit drugs.   Allergies:  Allergies  Allergen Reactions  . Prednisone Other (See Comments)    REACTION: Altered mental status, hyperactivity     Medications Prior to Admission  Medication Sig Dispense Refill  . aspirin EC 81 MG tablet Take 81 mg by mouth every morning.    Marland Kitchen. BYSTOLIC 5 MG tablet Take 5 mg by mouth daily.  3  . cloNIDine (CATAPRES) 0.1 MG tablet Take 0.1 mg by mouth at bedtime.   3  . Iron-Vitamins (GERITOL COMPLETE PO) Take 1 tablet by mouth daily.    Marland Kitchen. levothyroxine (SYNTHROID, LEVOTHROID) 50 MCG tablet Take 50 mcg by mouth daily.  3  . LORazepam (ATIVAN) 0.5 MG tablet Take 0.25 mg by mouth at bedtime.     . methyldopa (ALDOMET) 250 MG tablet Take 250 mg by mouth 2 (two) times daily.  3  . polyethylene glycol powder (GLYCOLAX/MIRALAX) powder Take 17 g by mouth at bedtime.    . senna-docusate (SENNA PLUS) 8.6-50 MG per tablet Take 1 tablet by mouth at bedtime.     Marland Kitchen. telmisartan (MICARDIS) 40 MG tablet Take 40  mg by mouth every morning.    . verapamil (CALAN-SR) 240 MG CR tablet Take 240 mg by mouth every morning.    . Naphazoline HCl (CLEAR EYES OP) Apply 1 drop to eye daily as needed (for dry eye relief).          WUJ:[id number]:apart from the symptoms mentioned above,there are no other symptoms referable to all systems reviewed.  Physical Exam: Blood pressure 141/86, pulse 149, temperature 100 F (37.8 C), temperature source Axillary, resp. rate 46, height 5\' 2"  (1.575 m), weight 89 lb 15.2 oz (40.8 kg), SpO2 95 %. Lungs diminished breath sounds in bases prolonged expiratory phase no rales wheeze rhonchi appreciable heart regular rhythm 1/6 aortic flow murmur no S3-S4 no heaves feels rubs abdomen soft nontender bowel sounds normoactive neurologic patient responds to tactile and verbal and painful stimuli only does not follow verbal commands  @LABTEST @ No results found for this or any previous visit (from the past 240 hour(s)).  Ct Head Wo Contrast  08/16/2015  CLINICAL DATA:  Altered level of consciousness and sudden onset of left-sided weakness. EXAM: CT HEAD WITHOUT CONTRAST TECHNIQUE: Contiguous axial images were obtained from the base of the skull through the vertex without contrast. COMPARISON:  05/21/2012 FINDINGS: Age appropriate atrophy with mild dilatation of the lateral ventricles. Again noted is diffuse low density throughout the periventricular white matter which is unchanged.  No evidence for acute hemorrhage, mass lesion, midline shift, hydrocephalus or large infarct. Patient has extensive paranasal sinus disease. Near complete opacification of the sphenoid sinuses. Complete opacification in the left maxillary sinus. Extensive opacification in the bilateral ethmoid air cells, left side greater than right. Complete opacification of the left frontal sinus. Mastoid air cells are clear. No acute bone abnormality. IMPRESSION: No acute intracranial abnormality. Stable atrophy with white matter changes.  White matter changes may represent chronic small vessel ischemic disease. Extensive paranasal sinus disease as described. These results were called by telephone at the time of interpretation on 08/16/2015 at 9:07 pm to Dr. Linwood Dibbles , who verbally acknowledged these results. Electronically Signed   By: Richarda Overlie M.D.   On: 08/16/2015 21:07   Mr Maxine Glenn Head Wo Contrast  08/17/2015  CLINICAL DATA:  81 year old female with left side weakness and confusion. Initial encounter. EXAM: MRA HEAD WITHOUT CONTRAST TECHNIQUE: Angiographic images of the Circle of Willis were obtained using MRA technique without intravenous contrast. COMPARISON:  Brain MRI from 0816 hours today reported separately. Head CT 08/16/2015. FINDINGS: Mildly degraded by motion despite repeated imaging attempts. Antegrade flow in the posterior circulation with codominant appearing distal vertebral arteries. Patent basilar artery without stenosis. SCA and PCA origins appear patent. Bilateral PCA branches are within normal limits. Posterior communicating arteries are diminutive or absent. Antegrade flow in both ICA siphons. No siphon stenosis. Cavernous and supraclinoid irregularity in keeping with atherosclerosis, greater on the left. Patent carotid termini. Normal ACA and MCA origins. Left MCA M1 segment, bifurcation, and left MCA branches are within normal limits. Anterior communicating artery is diminutive or absent. Bilateral ACA branches are within normal limits. The right MCA origin and M1 segment are patent. The right MCA bifurcation is patent. The dominant anterior M2 division is occluded about 9 mm from its origin. The other visualized right MCA branches are within normal limits. IMPRESSION: 1. Anterior right MCA M2 branch occlusion about 9 mm beyond the right MCA bifurcation. See MRI from today reported separately. 2. Otherwise negative intracranial MRA. Electronically Signed   By: Odessa Fleming M.D.   On: 08/17/2015 08:53   Mr Brain Wo  Contrast  08/17/2015  CLINICAL DATA:  Left-sided weakness and confusion. EXAM: MRI HEAD WITHOUT CONTRAST TECHNIQUE: Multiplanar, multiecho pulse sequences of the brain and surrounding structures were obtained without intravenous contrast. COMPARISON:  CT head without contrast 08/16/2015. FINDINGS: Acute nonhemorrhagic infarct is evident within the anterior left right MCA distribution. This involves the right frontal operculum and anterior aspect of the insular cortex. The primary motor cortex is spared. The basal ganglia are intact. Atrophy and diffuse chronic white matter change is noted. White matter disease extends into the brainstem. Flow is present in the major intracranial arteries. The globes and orbits are intact. There is extensive opacification of left anterior ethmoid air cells. The sphenoid sinuses are opacified bilaterally. Scattered right ethmoid sinus disease is present. The right maxillary sinus spared. The mastoid air cells are clear. IMPRESSION: 1. Acute nonhemorrhagic infarct involving the right frontal operculum in anterior insular cortex compatible with focal obstruction of the anterior right M2 division seen on the MRA. 2. Extensive atrophy and white matter disease compatible with chronic microvascular disease. 3. Diffuse sinus disease. Electronically Signed   By: Marin Roberts M.D.   On: 08/17/2015 08:56   US Carotid Bilateral  08/18/2015  CLINICAL DATA:  Stroke.  Hypertension. EXAM: BILATERAL CAROTID DUPLEX ULTRASOUND TECHNIQUE: Wallace Cullens scale imaging, color Doppler and duplex ultrasound  was performed of bilateral carotid and vertebral arteries in the neck. COMPARISON:  None. REVIEW OF SYSTEMS: Quantification of carotid stenosis is based on velocity parameters that correlate the residual internal carotid diameter with NASCET-based stenosis levels, using the diameter of the distal internal carotid lumen as the denominator for stenosis measurement. The following velocity measurements  were obtained: PEAK SYSTOLIC/END DIASTOLIC RIGHT ICA:                     103/12cm/sec CCA:                     75/6cm/sec SYSTOLIC ICA/CCA RATIO:  1.4 DIASTOLIC ICA/CCA RATIO: 2.1 ECA:                     72cm/sec LEFT ICA:                     96/9cm/sec CCA:                     68/6cm/sec SYSTOLIC ICA/CCA RATIO:  1.4 DIASTOLIC ICA/CCA RATIO: 1.5 ECA:                     83cm/sec FINDINGS: RIGHT CAROTID ARTERY: Circumferential plaque in the carotid bulb and proximal ICA resulting in at least mild stenosis. Normal waveforms and color Doppler signal. RIGHT VERTEBRAL ARTERY:  Normal flow direction and waveform. LEFT CAROTID ARTERY: Circumferential mild partially calcified plaque in the bulb, extending to proximal internal and external carotid arteries. No high-grade stenosis. Normal waveforms and color Doppler signal. A nonspecific cardiac arrhythmias noted. LEFT VERTEBRAL ARTERY: Normal flow direction and waveform. IMPRESSION: 1. Bilateral carotid bifurcation and proximal ICA plaque, resulting in less than 50% diameter stenosis. The exam does not exclude plaque ulceration or embolization. Continued surveillance recommended. Electronically Signed   By: Corlis Leak M.D.   On: 08/18/2015 08:06   Dg Chest Portable 1 View  08/16/2015  CLINICAL DATA:  Syncope. Drowsiness and disorientation, acute onset. Initial encounter. EXAM: PORTABLE CHEST 1 VIEW COMPARISON:  Chest radiograph from 08/14/2015 FINDINGS: The lungs are hyperexpanded, with flattening of the hemidiaphragms, compatible with COPD. A small left pleural effusion is noted, with left basilar airspace opacity. There is no evidence of pneumothorax. The cardiomediastinal silhouette is borderline normal in size. No acute osseous abnormalities are seen. IMPRESSION: Small left pleural effusion, with left basilar airspace opacity. This could reflect pneumonia. Underlying findings of COPD. Electronically Signed   By: Roanna Raider M.D.   On: 08/16/2015 22:27   Dg  Chest Port 1 View  08/14/2015  CLINICAL DATA:  Generalized weakness and abdominal pain, history of dementia. Headaches for past week. EXAM: PORTABLE CHEST 1 VIEW COMPARISON:  None. FINDINGS: There is mild cardiomegaly. Atherosclerotic changes noted along the walls of the grossly normal- caliber aortic arch. Pulmonary vasculature is within normal limits. Lungs appear hyperexpanded suggesting COPD. Suspect mild chronic scarring/fibrosis within each lung, upper lobe predominant. No confluent airspace opacity to suggest a developing pneumonia. No pleural effusions seen. No pneumothorax. Osseous structures are unremarkable. IMPRESSION: 1. Hyperexpanded lungs suggesting COPD. 2. Mild cardiomegaly. 3. No evidence of acute cardiopulmonary abnormality. Electronically Signed   By: Bary  M.D.   On: 08/14/2015 16:42   Impression: Right middle cerebral artery CVA with obtundation and expectant death  Principal Problem:   Syncope Active Problems:   Altered mental status   Left-sided weakness   Dehydration   COPD (chronic obstructive  pulmonary disease) (HCC)   Chronic respiratory failure with hypoxia (HCC)   Cerebral thrombosis with cerebral infarction   Protein-calorie malnutrition, severe     Plan: Admit to general inpatient care with hospice consult continue comfort care measures to consist of keeping mouth hydrated with bullous syringe and water benzo diazepam's and opioid analgesia as indicated      Lynora Dymond M 09-12-15, 11:32 AM

## 2015-09-20 DEATH — deceased

## 2016-03-27 IMAGING — CR DG CHEST 1V PORT
1 series · 1 of 1 positions shown · non-contrast
Comparison: Chest radiograph from 08/14/2015

CLINICAL DATA: Syncope. Drowsiness and disorientation, acute onset.
Initial encounter.

EXAM:
PORTABLE CHEST 1 VIEW

[ap portable]
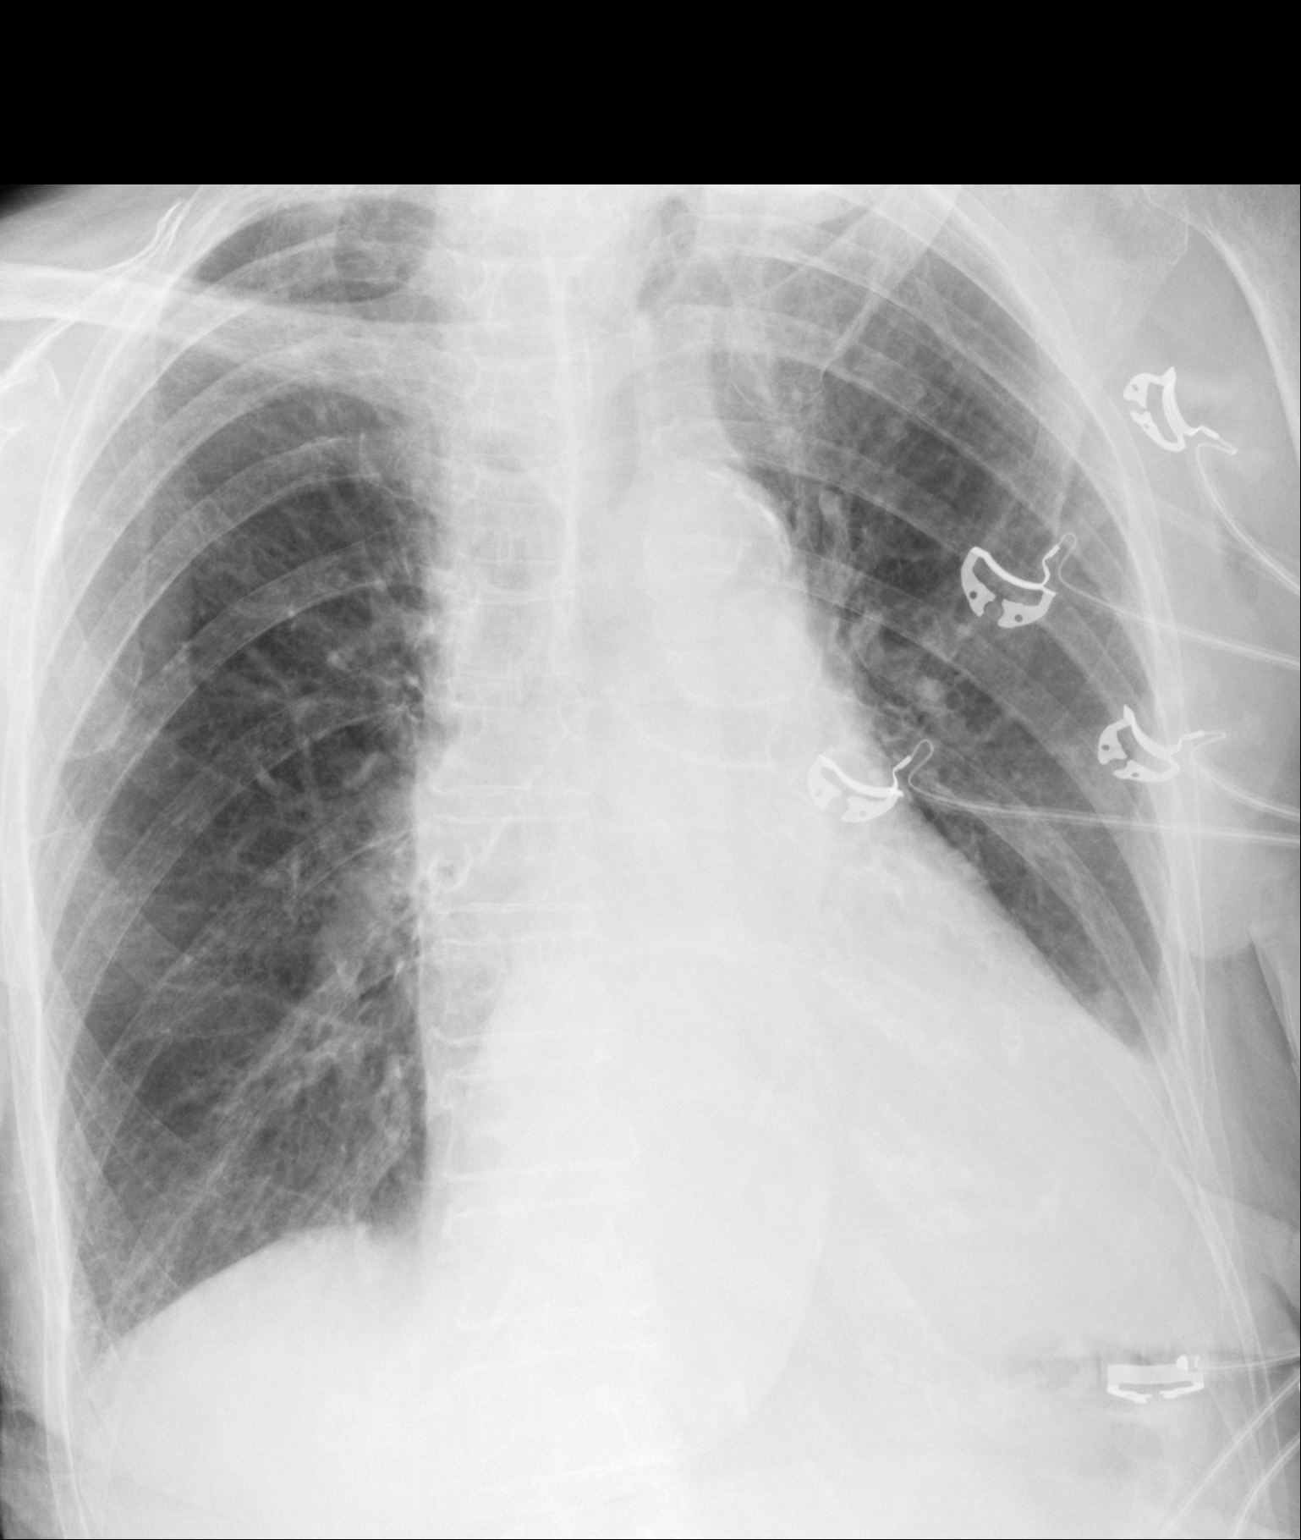

[1 of 1 positions shown; findings below may reference images not displayed]

FINDINGS: The lungs are hyperexpanded, with flattening of the hemidiaphragms,
compatible with COPD. A small left pleural effusion is noted, with
left basilar airspace opacity. There is no evidence of pneumothorax.

The cardiomediastinal silhouette is borderline normal in size. No
acute osseous abnormalities are seen.
IMPRESSION: Small left pleural effusion, with left basilar airspace opacity.
This could reflect pneumonia. Underlying findings of COPD.
